# Patient Record
Sex: Female | Born: 1960
Health system: Southern US, Community
[De-identification: ages and names within clinical notes are randomized; demographics above are authoritative.]

## PROBLEM LIST (undated history)

## (undated) HISTORY — PX: APPENDECTOMY: SHX54

## (undated) HISTORY — PX: FOOT SURGERY: SHX648

## (undated) HISTORY — PX: CARPAL TUNNEL RELEASE: SHX101

## (undated) HISTORY — PX: OTHER SURGICAL HISTORY: SHX169

---

## 1998-04-04 ENCOUNTER — Other Ambulatory Visit: Admission: RE | Admit: 1998-04-04 | Discharge: 1998-04-04 | Payer: Self-pay | Admitting: Obstetrics & Gynecology

## 1998-09-06 ENCOUNTER — Ambulatory Visit (HOSPITAL_BASED_OUTPATIENT_CLINIC_OR_DEPARTMENT_OTHER): Admission: RE | Admit: 1998-09-06 | Discharge: 1998-09-06 | Payer: Self-pay | Admitting: Orthopedic Surgery

## 1999-01-17 ENCOUNTER — Ambulatory Visit (HOSPITAL_BASED_OUTPATIENT_CLINIC_OR_DEPARTMENT_OTHER): Admission: RE | Admit: 1999-01-17 | Discharge: 1999-01-17 | Payer: Self-pay | Admitting: Orthopedic Surgery

## 1999-04-16 ENCOUNTER — Other Ambulatory Visit: Admission: RE | Admit: 1999-04-16 | Discharge: 1999-04-16 | Payer: Self-pay | Admitting: Obstetrics & Gynecology

## 2000-04-24 ENCOUNTER — Other Ambulatory Visit: Admission: RE | Admit: 2000-04-24 | Discharge: 2000-04-24 | Payer: Self-pay | Admitting: Obstetrics & Gynecology

## 2000-04-25 ENCOUNTER — Other Ambulatory Visit: Admission: RE | Admit: 2000-04-25 | Discharge: 2000-04-25 | Payer: Self-pay | Admitting: Obstetrics & Gynecology

## 2001-04-28 ENCOUNTER — Other Ambulatory Visit: Admission: RE | Admit: 2001-04-28 | Discharge: 2001-04-28 | Payer: Self-pay | Admitting: Obstetrics & Gynecology

## 2002-04-26 ENCOUNTER — Other Ambulatory Visit: Admission: RE | Admit: 2002-04-26 | Discharge: 2002-04-26 | Payer: Self-pay | Admitting: Obstetrics & Gynecology

## 2003-05-19 ENCOUNTER — Other Ambulatory Visit: Admission: RE | Admit: 2003-05-19 | Discharge: 2003-05-19 | Payer: Self-pay | Admitting: Obstetrics & Gynecology

## 2004-06-13 ENCOUNTER — Other Ambulatory Visit: Admission: RE | Admit: 2004-06-13 | Discharge: 2004-06-13 | Payer: Self-pay | Admitting: Obstetrics & Gynecology

## 2005-06-20 ENCOUNTER — Other Ambulatory Visit: Admission: RE | Admit: 2005-06-20 | Discharge: 2005-06-20 | Payer: Self-pay | Admitting: Obstetrics & Gynecology

## 2009-08-11 ENCOUNTER — Encounter: Admission: RE | Admit: 2009-08-11 | Discharge: 2009-08-11 | Payer: Self-pay | Admitting: Obstetrics & Gynecology

## 2010-09-07 IMAGING — US US BREAST R
1 series · 6 of 6 positions shown · non-contrast
Comparison: Bilateral mammogram July 07, 2007 from [HOSPITAL]
OB/GYN.

CLINICAL DATA: 6-month reevaluation of right breast.  Fibrocystic
changes were identified the 10 o'clock position of the right breast
and January 2009 at [REDACTED].  The patient also
presents for annual evaluation of the left breast.

DIGITAL DIAGNOSTIC  BILATERAL  MAMMOGRAM  WITH CAD AND RIGHT BREAST
ULTRASOUND:

[Series 1: us breast right · 6 of 6 slices shown]
[im 1/6]
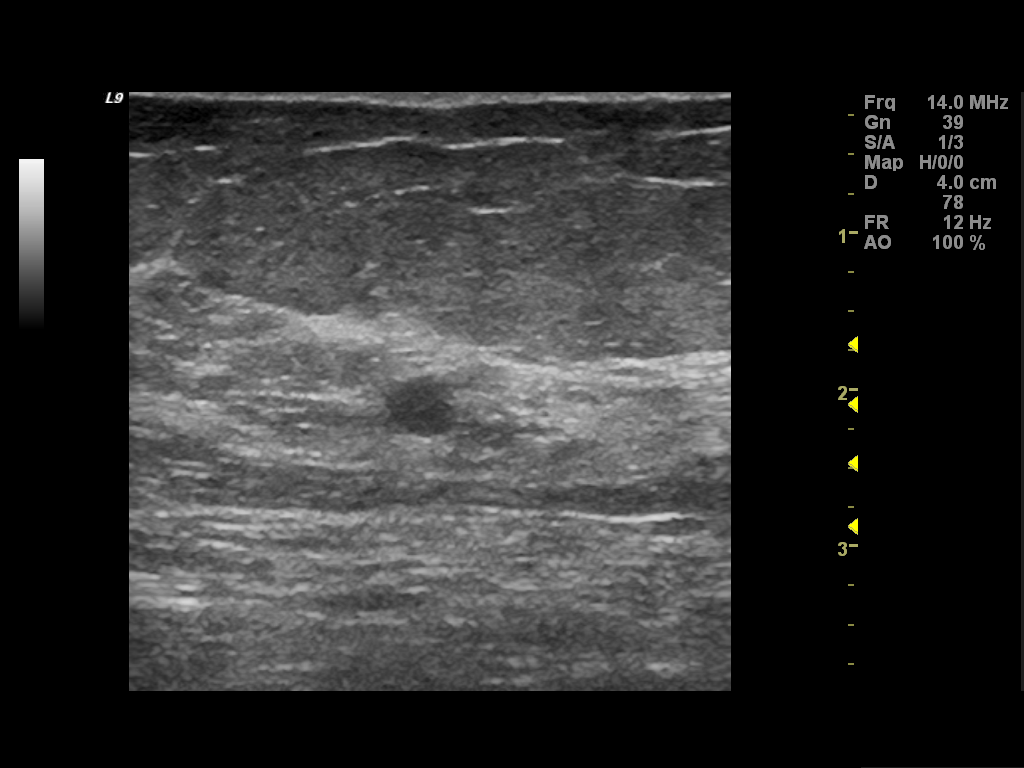
[im 2/6]
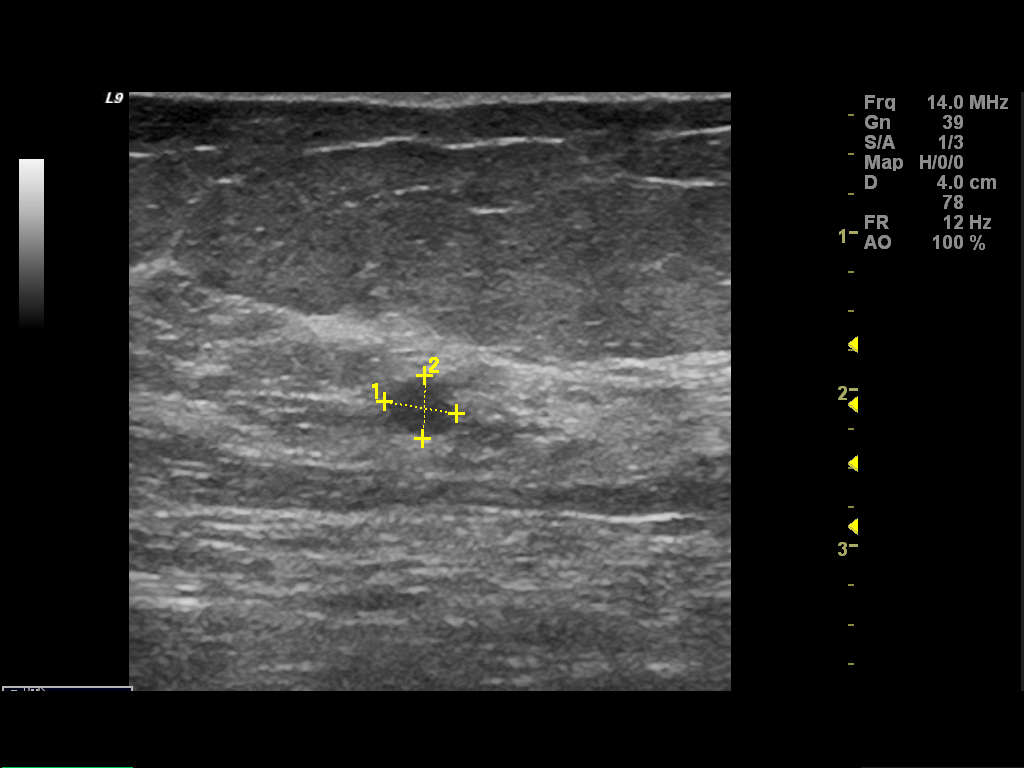
[im 3/6]
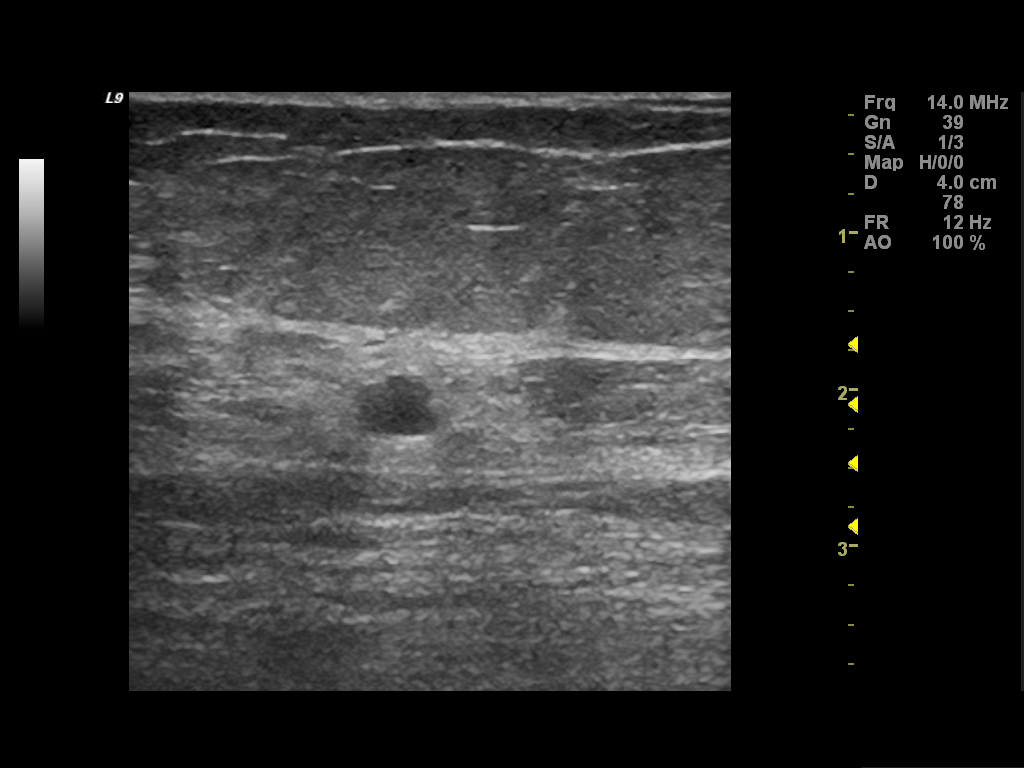
[im 4/6]
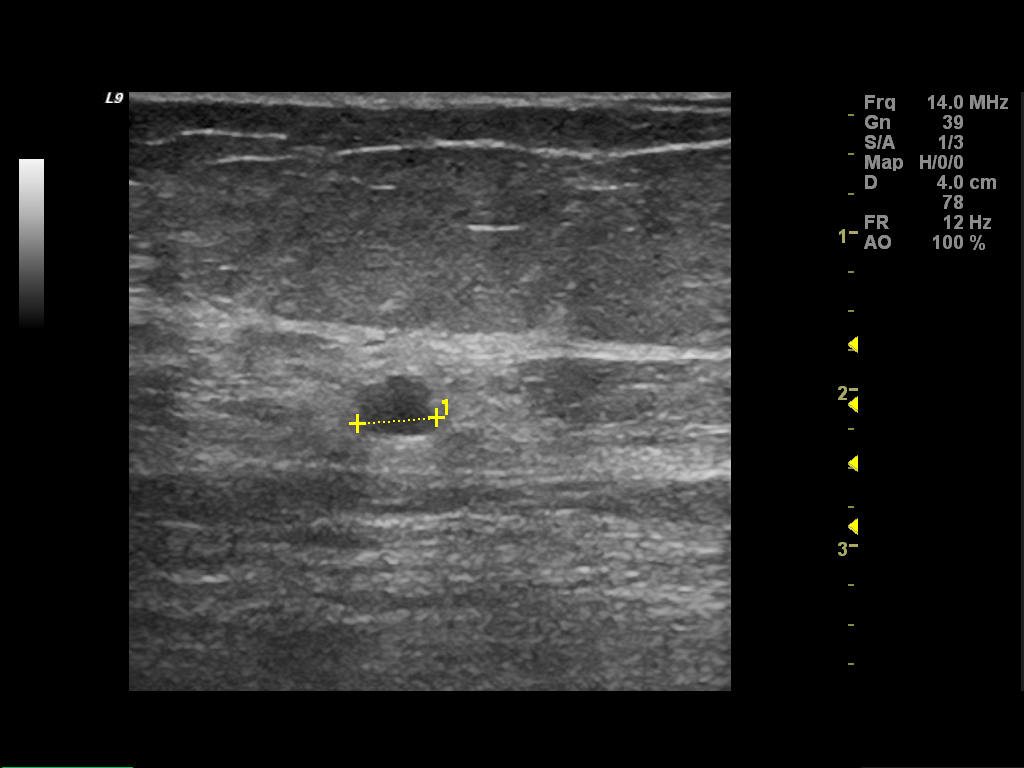
[im 5/6]
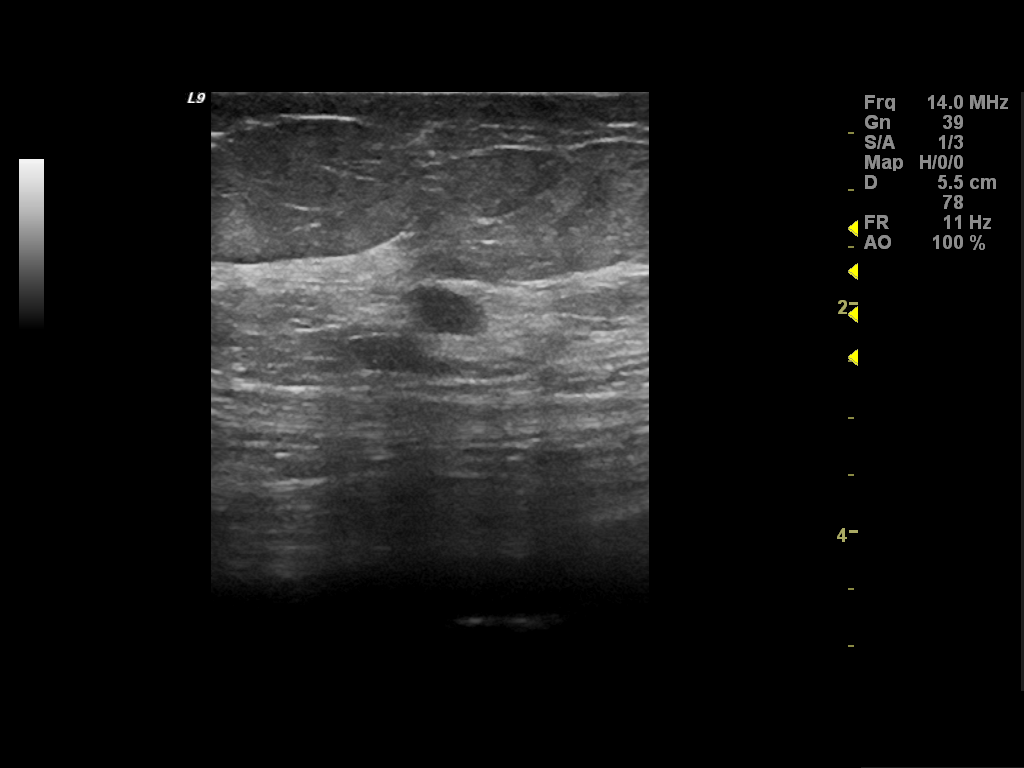
[im 6/6]
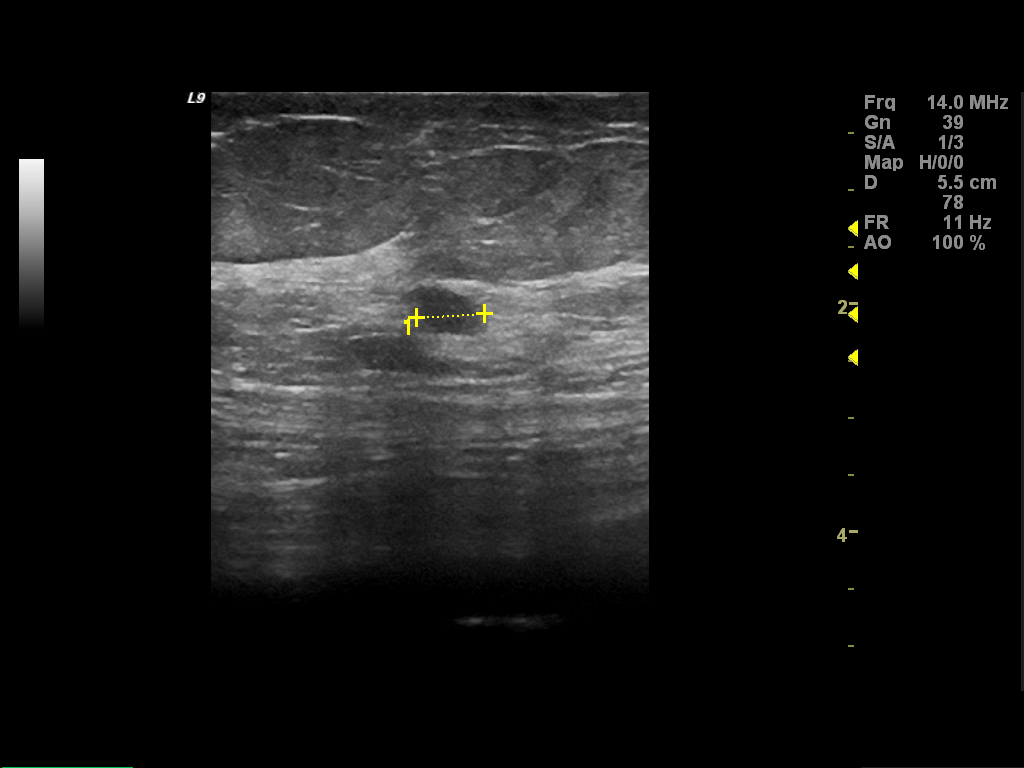

[6 of 6 positions shown; findings below may reference images not displayed]

FINDINGS: The breast parenchyma is heterogeneously dense
bilaterally.  There is stable nodularity in the upper outer
quadrant of the right breast that appears unchanged compared to the
mammogram of 7777.  No suspicious mass, suspicious
microcalcifications, or architectural distortion is identified in
either breast.

On physical exam, no mass is palpated in the upper outer right
breast.

Ultrasound is performed, showing two small cysts in the 10 o'clock
region of the right breast approximately 4 cm from the nipple.  No
suspicious findings are identified.
IMPRESSION: No evidence of malignancy is identified in either breast.  Stable
parenchymal pattern of both breasts.  Nodularity in the outer right
breast is unchanged from 0858, and  small cysts are seen in the
upper outer right breast on ultrasound.  A screening mammogram of
both breasts is recommended in 1 year.

BI-RADS CATEGORY 2:  Benign finding(s).

## 2016-06-04 DIAGNOSIS — D1801 Hemangioma of skin and subcutaneous tissue: Secondary | ICD-10-CM | POA: Diagnosis not present

## 2016-06-04 DIAGNOSIS — D485 Neoplasm of uncertain behavior of skin: Secondary | ICD-10-CM | POA: Diagnosis not present

## 2016-06-04 DIAGNOSIS — L820 Inflamed seborrheic keratosis: Secondary | ICD-10-CM | POA: Diagnosis not present

## 2016-06-04 DIAGNOSIS — L821 Other seborrheic keratosis: Secondary | ICD-10-CM | POA: Diagnosis not present

## 2016-06-04 DIAGNOSIS — L578 Other skin changes due to chronic exposure to nonionizing radiation: Secondary | ICD-10-CM | POA: Diagnosis not present

## 2016-10-30 DIAGNOSIS — H5203 Hypermetropia, bilateral: Secondary | ICD-10-CM | POA: Diagnosis not present

## 2016-11-25 DIAGNOSIS — Z1231 Encounter for screening mammogram for malignant neoplasm of breast: Secondary | ICD-10-CM | POA: Diagnosis not present

## 2016-11-25 DIAGNOSIS — Z6834 Body mass index (BMI) 34.0-34.9, adult: Secondary | ICD-10-CM | POA: Diagnosis not present

## 2016-11-25 DIAGNOSIS — Z01419 Encounter for gynecological examination (general) (routine) without abnormal findings: Secondary | ICD-10-CM | POA: Diagnosis not present

## 2017-05-02 DIAGNOSIS — H5203 Hypermetropia, bilateral: Secondary | ICD-10-CM | POA: Diagnosis not present

## 2017-06-11 DIAGNOSIS — D485 Neoplasm of uncertain behavior of skin: Secondary | ICD-10-CM | POA: Diagnosis not present

## 2017-06-11 DIAGNOSIS — L820 Inflamed seborrheic keratosis: Secondary | ICD-10-CM | POA: Diagnosis not present

## 2017-06-13 DIAGNOSIS — E785 Hyperlipidemia, unspecified: Secondary | ICD-10-CM | POA: Diagnosis not present

## 2017-06-13 DIAGNOSIS — Z6831 Body mass index (BMI) 31.0-31.9, adult: Secondary | ICD-10-CM | POA: Diagnosis not present

## 2017-06-13 DIAGNOSIS — Z1389 Encounter for screening for other disorder: Secondary | ICD-10-CM | POA: Diagnosis not present

## 2017-11-13 ENCOUNTER — Ambulatory Visit (INDEPENDENT_AMBULATORY_CARE_PROVIDER_SITE_OTHER): Payer: BLUE CROSS/BLUE SHIELD | Admitting: Sports Medicine

## 2017-11-13 ENCOUNTER — Ambulatory Visit (INDEPENDENT_AMBULATORY_CARE_PROVIDER_SITE_OTHER): Payer: BLUE CROSS/BLUE SHIELD

## 2017-11-13 ENCOUNTER — Encounter: Payer: Self-pay | Admitting: Sports Medicine

## 2017-11-13 VITALS — BP 129/80 | HR 77 | Ht 62.0 in | Wt 177.0 lb

## 2017-11-13 DIAGNOSIS — Q72891 Other reduction defects of right lower limb: Secondary | ICD-10-CM | POA: Diagnosis not present

## 2017-11-13 DIAGNOSIS — G5761 Lesion of plantar nerve, right lower limb: Secondary | ICD-10-CM

## 2017-11-13 DIAGNOSIS — G5781 Other specified mononeuropathies of right lower limb: Secondary | ICD-10-CM

## 2017-11-13 DIAGNOSIS — M21619 Bunion of unspecified foot: Secondary | ICD-10-CM

## 2017-11-13 DIAGNOSIS — M2040 Other hammer toe(s) (acquired), unspecified foot: Secondary | ICD-10-CM

## 2017-11-13 DIAGNOSIS — M779 Enthesopathy, unspecified: Secondary | ICD-10-CM | POA: Diagnosis not present

## 2017-11-13 DIAGNOSIS — M79671 Pain in right foot: Secondary | ICD-10-CM

## 2017-11-13 MED ORDER — TRIAMCINOLONE ACETONIDE 10 MG/ML IJ SUSP: 10.0000 mg | Freq: Once | INTRAMUSCULAR | Status: AC

## 2017-11-13 NOTE — Progress Notes (Addendum)
Subjective: Melissa Mcguire is a 56 y.o. female patient who presents to office for evaluation of right foot pain. Patient complains of progressive pain especially over the last 2-3 weeks in the right foot at the second and third toes consisting of burning and stinging with occasional numbness.  Patient states that the ball of her foot feels very swollen and enlarged.  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 Patient has tried changing shoes with no relief in symptoms. Patient denies any other pedal complaints. Denies known injury/trip/fall/sprain/any causative factors.   Review of Systems  All other systems reviewed and are negative.   There are no active problems to display for this patient.   No current outpatient medications on file prior to visit.   No current facility-administered medications on file prior to visit.     Allergies  Allergen Reactions  . Butorphanol Nausea And Vomiting and Other (See Comments)    Sedation   . Codeine Nausea And Vomiting and Other (See Comments)    Sedation     Objective:  General: Alert and oriented x3 in no acute distress  Dermatology: No open lesions bilateral lower extremities, no webspace macerations, no ecchymosis bilateral, all nails x 10 are well manicured.  Vascular: Dorsalis Pedis and Posterior Tibial pedal pulses palpable, Capillary Fill Time 3 seconds,(+) pedal hair growth bilateral, no edema bilateral lower extremities, Temperature gradient within normal limits.  Neurology: Johney Maine sensation intact via light touch bilateral,  No babinski sign present bilateral. (- )Tinels sign bilateral.  No palpable Mulder's click however there is significant fullness at the ball of the right foot and at the intermetatarsal space of the second and third metatarsals on the right likely consistent with neuroma.  Musculoskeletal: Mild tenderness with palpation at the ball of right  foot,No pain with calf compression bilateral. There is decreased ankle rom with knee extending  vs flexed resembling gastroc equnius bilateral, Subtalar joint range of motion is within normal limits, there is no 1st ray hypermobility noted bilateral, decreased 1st MPJ rom Right>Left with functional limitus and bunion noted on weightbearing exam.  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  Strength within normal limits in all groups bilateral.   Gait: Antalgic gait  Xrays  Right foot   Impression: There is mild joint space narrowing at the midtarsal joint consistent of with early arthritis there is deviation of the first metatarsal phalangeal joint and first metatarsal consistent with bunion deformity there is a lesser digital contractures consistent with hammertoe deformity there is narrowing of the intermetatarsal space specifically at the second and third metatarsals there is a congenitally short fourth metatarsal consistent with brachymetatarsia there are no fractures no breaks or acute findings at this time.  Assessment and Plan: Problem List Items Addressed This Visit    None    Visit Diagnoses    Neuroma digital nerve, right    -  Primary   Capsulitis       Relevant Orders   DG Foot Complete Right   Bunion       Hammer toe, unspecified laterality       Brachymetatarsia of right foot       Right foot pain           -Complete examination performed -Xrays reviewed -Discussed treatement options for likely for fluid overload secondary to structural deformity with irritation of nerve and between the right second  and third toes with surrounding joint swelling likely consistent with capsulitis versus neuroma -Patient declined oral medication and states that she does not like to take anything by mouth because she is very sensitive to medicine -After oral consent and aseptic prep, injected a mixture containing 1 ml of 2%  plain lidocaine, 1 ml  0.5% plain marcaine, 0.5 ml of kenalog 10 and 0.5 ml of dexamethasone phosphate into the second interspace at the area of fullness and suspected neuroma without complication. Post-injection care discussed with patient.  -Applied metatarsal padding to shoes, recommend good supportive shoes daily refraining from barefoot walking -Advised patient rest ice elevation and modification of activities -Patient to return to office in 3 weeks or sooner if condition worsens.  If condition still worse then will consider adding ultrasound or MRI for further evaluation if patient is doing better patient long-term will benefit from custom orthotics for mechanical control.  Landis Martins, DPM

## 2017-11-13 NOTE — Patient Instructions (Signed)
Morton Neuralgia Morton neuralgia is a type of foot pain in the area closest to your toes. This area is sometimes called the ball of your foot. Morton neuralgia occurs when a branch of a nerve in your foot (digital nerve) becomes compressed. When this happens over a long period of time, the nerve can thicken (neuroma) and cause pain. This usually occurs between the third and fourth toe. Morton neuralgia can come and go but may get worse over time. What are the causes? Your digital nerve can become compressed and stretched at a point where it passes under a thick band of tissue that connects your toes (intermetatarsal ligament). Morton neuralgia can be caused by mild repetitive damage in this area. This type of damage can result from:  Activities such as running or jumping.  Wearing shoes that are too tight.  What increases the risk? You may be at risk for Morton neuralgia if you:  Are female.  Wear high heels.  Wear shoes that are narrow or tight.  Participate in activities that stretch your toes. These include: ? Running. ? Ballet. ? Long-distance walking.  What are the signs or symptoms? The first symptom of Morton neuralgia is pain that spreads from the ball of your foot to your toes. It may feel like you are walking on a marble. Pain usually gets worse with walking and goes away at night. Other symptoms may include numbness and cramping of your toes. How is this diagnosed? Your health care provider will do a physical exam. When doing the exam, your health care provider may:  Squeeze your foot just behind your toe.  Ask you to move your toes to check for pain.  You may also have tests on your foot to confirm the diagnosis. These may include:  An X-ray.  An MRI.  How is this treated? Treatment for Morton neuralgia may be as simple as changing the kind of shoes you wear. Other treatments may include:  Wearing a supportive pad (orthosis) under the front of your foot. This  lifts your toe bones and takes pressure off the nerve.  Getting injections of numbing medicine and anti-inflammatory medicine (steroid) in the nerve.  Having surgery to remove part of the thickened nerve.  Follow these instructions at home:  Take medicine only as directed by your health care provider.  Wear soft-soled shoes with a wide toe area.  Stop activities that may be causing pain.  Elevate your foot when resting.  Massage your foot.  Apply ice to the injured area: ? Put ice in a plastic bag. ? Place a towel between your skin and the bag. ? Leave the ice on for 20 minutes, 2-3 times a day.  Keep all follow-up visits as directed by your health care provider. This is important. Contact a health care provider if:  Home care instructions are not helping you get better.  Your symptoms change or get worse. This information is not intended to replace advice given to you by your health care provider. Make sure you discuss any questions you have with your health care provider. Document Released: 03/10/2001 Document Revised: 05/09/2016 Document Reviewed: 02/02/2014 Elsevier Interactive Patient Education  2018 [employer]  

## 2017-11-18 ENCOUNTER — Other Ambulatory Visit: Payer: Self-pay | Admitting: Sports Medicine

## 2017-11-18 DIAGNOSIS — G5781 Other specified mononeuropathies of right lower limb: Secondary | ICD-10-CM

## 2017-12-01 DIAGNOSIS — Z1231 Encounter for screening mammogram for malignant neoplasm of breast: Secondary | ICD-10-CM | POA: Diagnosis not present

## 2017-12-01 DIAGNOSIS — Z6833 Body mass index (BMI) 33.0-33.9, adult: Secondary | ICD-10-CM | POA: Diagnosis not present

## 2017-12-01 DIAGNOSIS — Z01419 Encounter for gynecological examination (general) (routine) without abnormal findings: Secondary | ICD-10-CM | POA: Diagnosis not present

## 2017-12-04 ENCOUNTER — Encounter: Payer: Self-pay | Admitting: Sports Medicine

## 2017-12-04 ENCOUNTER — Ambulatory Visit (INDEPENDENT_AMBULATORY_CARE_PROVIDER_SITE_OTHER): Payer: BLUE CROSS/BLUE SHIELD | Admitting: Sports Medicine

## 2017-12-04 DIAGNOSIS — G5761 Lesion of plantar nerve, right lower limb: Secondary | ICD-10-CM | POA: Diagnosis not present

## 2017-12-04 DIAGNOSIS — M2040 Other hammer toe(s) (acquired), unspecified foot: Secondary | ICD-10-CM | POA: Diagnosis not present

## 2017-12-04 DIAGNOSIS — M779 Enthesopathy, unspecified: Secondary | ICD-10-CM | POA: Diagnosis not present

## 2017-12-04 DIAGNOSIS — G5781 Other specified mononeuropathies of right lower limb: Secondary | ICD-10-CM

## 2017-12-04 DIAGNOSIS — Q72891 Other reduction defects of right lower limb: Secondary | ICD-10-CM

## 2017-12-04 DIAGNOSIS — M79671 Pain in right foot: Secondary | ICD-10-CM | POA: Diagnosis not present

## 2017-12-04 DIAGNOSIS — M21619 Bunion of unspecified foot: Secondary | ICD-10-CM

## 2017-12-04 NOTE — Progress Notes (Signed)
Subjective: Melissa Mcguire is a 57 y.o. female patient who returns to office for follow up evaluation of right foot pain. Patient states that the injection helped, stayed bruised after the shot but it feel better. Has a little twinge but no pain in the foot. States that the metatarsal pads have helped and changing shoes helps. Denies nausea,vomitting,fever, chills, nights sweats or constitutional symptoms.  There are no active problems to display for this patient.   No current outpatient medications on file prior to visit.   Current Facility-Administered Medications on File Prior to Visit  Medication Dose Route Frequency Provider Last Rate Last Dose  . triamcinolone acetonide (KENALOG) 10 MG/ML injection 10 mg  10 mg Other Once Landis Martins, DPM        Allergies  Allergen Reactions  . Butorphanol Nausea And Vomiting and Other (See Comments)    Sedation   . Codeine Nausea And Vomiting and Other (See Comments)    Sedation     Objective:  General: Alert and oriented x3 in no acute distress  Dermatology: No open lesions bilateral lower extremities, no webspace macerations, no ecchymosis bilateral, all nails x 10 are well manicured.  Vascular: Dorsalis Pedis and Posterior Tibial pedal pulses palpable, Capillary Fill Time 3 seconds,(+) pedal hair growth bilateral, no edema bilateral lower extremities, Temperature gradient within normal limits.  Neurology: Johney Maine sensation intact via light touch bilateral,  No babinski sign present bilateral. (- )Tinels sign bilateral.  No palpable Mulder's click however there is decreased fullness at the ball of the right foot and at the intermetatarsal space of the second and third metatarsals on the right likely consistent with neuroma.  Musculoskeletal: Decreased tenderness with palpation at the ball of right foot,No pain with calf compression bilateral. There is decreased ankle rom with knee extending  vs flexed resembling gastroc equnius bilateral,  Subtalar joint range of motion is within normal limits, there is no 1st ray hypermobility noted bilateral, decreased 1st MPJ rom Right>Left with functional limitus and bunion noted on weightbearing exam.  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  Strength within normal limits in all groups bilateral.   Assessment and Plan: Problem List Items Addressed This Visit    None    Visit Diagnoses    Capsulitis    -  Primary   Neuroma digital nerve, right       Bunion       Hammer toe, unspecified laterality       Brachymetatarsia of right foot       Right foot pain           -Complete examination performed -Discussed continued care for capsulitis secondary to structural deformity with irritation of nerve and between the right second and third toes with surrounding joint swelling likely consistent with capsulitis versus neuroma -No re-injection due to improving symptoms  -Dispensed metatarsal padding  -Advised patient rest ice elevation and modification of activities and topical pain creams as needed -Office to check orthotic coverage and then schedule for casting if patient desires them -Patient to return to office as needed or sooner if condition worsens.   Landis Martins, DPM

## 2018-01-22 ENCOUNTER — Ambulatory Visit (INDEPENDENT_AMBULATORY_CARE_PROVIDER_SITE_OTHER): Payer: BLUE CROSS/BLUE SHIELD | Admitting: Orthotics

## 2018-01-22 DIAGNOSIS — M79671 Pain in right foot: Secondary | ICD-10-CM

## 2018-01-22 DIAGNOSIS — M779 Enthesopathy, unspecified: Secondary | ICD-10-CM

## 2018-01-22 NOTE — Progress Notes (Signed)
Patient came into today to be cast for Custom Foot Orthotics. Upon recommendation of Dr. Cannon Kettle  Patient presents with capsulitis 2nd R Goals are reduce ff pressure and offload 2 met head Plan vendor [employer] to fab f/o w/ good arch support, met pad and offload 2nd R  Per Horris Latino:  Ok to [employer] quote of $300.Marland Kitchen  Patient to p/up in [place]

## 2018-02-26 ENCOUNTER — Ambulatory Visit (INDEPENDENT_AMBULATORY_CARE_PROVIDER_SITE_OTHER): Payer: BLUE CROSS/BLUE SHIELD | Admitting: *Deleted

## 2018-02-26 DIAGNOSIS — M779 Enthesopathy, unspecified: Secondary | ICD-10-CM

## 2018-02-26 DIAGNOSIS — M79671 Pain in right foot: Secondary | ICD-10-CM

## 2018-03-19 ENCOUNTER — Ambulatory Visit: Payer: BLUE CROSS/BLUE SHIELD | Admitting: Orthotics

## 2018-03-19 DIAGNOSIS — M21619 Bunion of unspecified foot: Secondary | ICD-10-CM

## 2018-03-19 DIAGNOSIS — M779 Enthesopathy, unspecified: Secondary | ICD-10-CM

## 2018-03-19 NOTE — Progress Notes (Signed)
Remake f/o w/ spenco cover, remove pad, and hug arch more.

## 2018-04-02 ENCOUNTER — Ambulatory Visit: Payer: BLUE CROSS/BLUE SHIELD | Admitting: Orthotics

## 2018-04-02 DIAGNOSIS — M21619 Bunion of unspecified foot: Secondary | ICD-10-CM

## 2018-04-02 NOTE — Progress Notes (Signed)
Patient picked up adjusted f/o arch is now higher and aptient is satisfied.

## 2018-04-09 ENCOUNTER — Ambulatory Visit: Payer: BLUE CROSS/BLUE SHIELD | Admitting: Sports Medicine

## 2018-04-16 ENCOUNTER — Ambulatory Visit (INDEPENDENT_AMBULATORY_CARE_PROVIDER_SITE_OTHER): Payer: BLUE CROSS/BLUE SHIELD | Admitting: Sports Medicine

## 2018-04-16 ENCOUNTER — Encounter: Payer: Self-pay | Admitting: Sports Medicine

## 2018-04-16 DIAGNOSIS — Z4789 Encounter for other orthopedic aftercare: Secondary | ICD-10-CM

## 2018-04-16 DIAGNOSIS — M779 Enthesopathy, unspecified: Secondary | ICD-10-CM

## 2018-04-16 DIAGNOSIS — M2040 Other hammer toe(s) (acquired), unspecified foot: Secondary | ICD-10-CM

## 2018-04-16 DIAGNOSIS — M21619 Bunion of unspecified foot: Secondary | ICD-10-CM

## 2018-04-16 DIAGNOSIS — Q72891 Other reduction defects of right lower limb: Secondary | ICD-10-CM | POA: Diagnosis not present

## 2018-04-16 DIAGNOSIS — M79671 Pain in right foot: Secondary | ICD-10-CM

## 2018-04-16 NOTE — Progress Notes (Signed)
Subjective: Melissa Mcguire is a 57 y.o. female patient who returns to office for follow up orthotic check and for evaluation of right foot pain. Patient states that the orthotics help, got them 2 weeks ago; decrease in pain to right foot however has pain at bunion after playing pickle ball a few weeks ago. Patient states that its better than it was but every now and again still has pain that shoots across the joint. Denies bruising, swelling, rubbing, or constitutional symptoms at this time.  There are no active problems to display for this patient.   No current outpatient medications on file prior to visit.   Current Facility-Administered Medications on File Prior to Visit  Medication Dose Route Frequency Provider Last Rate Last Dose  . triamcinolone acetonide (KENALOG) 10 MG/ML injection 10 mg  10 mg Other Once Landis Martins, DPM        Allergies  Allergen Reactions  . Butorphanol Nausea And Vomiting and Other (See Comments)    Sedation   . Codeine Nausea And Vomiting and Other (See Comments)    Sedation     Objective:  General: Alert and oriented x3 in no acute distress  Dermatology: No open lesions bilateral lower extremities, no webspace macerations, no ecchymosis bilateral, all nails x 10 are well manicured.  Vascular: Dorsalis Pedis and Posterior Tibial pedal pulses palpable, Capillary Fill Time 3 seconds,(+) pedal hair growth bilateral, no edema bilateral lower extremities, Temperature gradient within normal limits.  Neurology: Johney Maine sensation intact via light touch bilateral,  No babinski sign present bilateral. (- )Tinels sign bilateral.  No palpable Mulder's click however there is decreased fullness at the ball of the right foot and at the intermetatarsal space of the second and third metatarsals on the right likely consistent with neuroma.  Musculoskeletal: Decreased tenderness with palpation at the ball of right foot,No pain with calf compression bilateral. There is  decreased ankle rom with knee extending  vs flexed resembling gastroc equnius bilateral, Subtalar joint range of motion is within normal limits, there is no 1st ray hypermobility noted bilateral, decreased 1st MPJ rom Right>Left with functional limitus and bunion.  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  Strength within normal limits in all groups bilateral.   Orthotic: Contours arch and provides appropriate support to problem areas  Assessment and Plan: Problem List Items Addressed This Visit    None    Visit Diagnoses    Encounter for training in use of orthotic device    -  Primary   Bunion       Capsulitis       Hammer toe, unspecified laterality       Brachymetatarsia of right foot       Right foot pain          -Complete examination performed -Educated patient on long term use of orthotics and likely sprain that is slowly getting better at right bunion vs capsulitis -Patient declined injection or anti-inflammatories this visit  -Recommend continue with good supportive shoes and use of orthotics  -Advised patient to return to office if bunion pain does not continue to improve with rest, ice, elevation, topical pain creams and soaking -Patient to return to office as needed or sooner if condition worsens.   Landis Martins, DPM

## 2018-05-20 DIAGNOSIS — Z6833 Body mass index (BMI) 33.0-33.9, adult: Secondary | ICD-10-CM | POA: Diagnosis not present

## 2018-05-20 DIAGNOSIS — G4730 Sleep apnea, unspecified: Secondary | ICD-10-CM | POA: Diagnosis not present

## 2018-06-04 DIAGNOSIS — G4733 Obstructive sleep apnea (adult) (pediatric): Secondary | ICD-10-CM | POA: Diagnosis not present

## 2018-06-11 ENCOUNTER — Ambulatory Visit (INDEPENDENT_AMBULATORY_CARE_PROVIDER_SITE_OTHER): Payer: BLUE CROSS/BLUE SHIELD | Admitting: Sports Medicine

## 2018-06-11 ENCOUNTER — Telehealth: Payer: Self-pay | Admitting: *Deleted

## 2018-06-11 ENCOUNTER — Encounter: Payer: Self-pay | Admitting: Sports Medicine

## 2018-06-11 DIAGNOSIS — L820 Inflamed seborrheic keratosis: Secondary | ICD-10-CM | POA: Diagnosis not present

## 2018-06-11 DIAGNOSIS — L578 Other skin changes due to chronic exposure to nonionizing radiation: Secondary | ICD-10-CM | POA: Diagnosis not present

## 2018-06-11 DIAGNOSIS — C44319 Basal cell carcinoma of skin of other parts of face: Secondary | ICD-10-CM | POA: Diagnosis not present

## 2018-06-11 DIAGNOSIS — Q72891 Other reduction defects of right lower limb: Secondary | ICD-10-CM

## 2018-06-11 DIAGNOSIS — M79671 Pain in right foot: Secondary | ICD-10-CM

## 2018-06-11 DIAGNOSIS — M779 Enthesopathy, unspecified: Secondary | ICD-10-CM | POA: Diagnosis not present

## 2018-06-11 DIAGNOSIS — L821 Other seborrheic keratosis: Secondary | ICD-10-CM | POA: Diagnosis not present

## 2018-06-11 DIAGNOSIS — D3610 Benign neoplasm of peripheral nerves and autonomic nervous system, unspecified: Secondary | ICD-10-CM

## 2018-06-11 DIAGNOSIS — M2040 Other hammer toe(s) (acquired), unspecified foot: Secondary | ICD-10-CM

## 2018-06-11 DIAGNOSIS — Z01812 Encounter for preprocedural laboratory examination: Secondary | ICD-10-CM

## 2018-06-11 DIAGNOSIS — M21619 Bunion of unspecified foot: Secondary | ICD-10-CM

## 2018-06-11 MED ORDER — TRIAMCINOLONE ACETONIDE 10 MG/ML IJ SUSP: 10.0000 mg | Freq: Once | INTRAMUSCULAR | Status: AC

## 2018-06-11 NOTE — Telephone Encounter (Signed)
-----   Message from Landis Martins, [place] sent at 06/11/2018 10:11 AM EDT ----- Regarding: MRI R foot Eval for 2nd interspace neuroma Patient also has pain over bunion and numbness

## 2018-06-11 NOTE — Telephone Encounter (Signed)
Orders to J. Quintana, RN for pre-cert. 

## 2018-06-11 NOTE — Progress Notes (Signed)
Subjective: Melissa Mcguire is a 57 y.o. female patient who returns to office for follow up evaluation of right foot pain. Patient states that the orthotics help, however still has pain especially first few steps out of bed in the morning with feeling fullness to the ball that has been progressive over the last few weeks reports that the pain is 7 out of 10 also with numbness to the second and third toes and over the bunion.  Patient reports that she wants to discuss possible long-term treatment for this.  Patient states that injections in past have helped however she feels like it is temporary and does not give her long-term relief. Denies constitutional symptoms at this time.  There are no active problems to display for this patient.   No current outpatient medications on file prior to visit.   Current Facility-Administered Medications on File Prior to Visit  Medication Dose Route Frequency Provider Last Rate Last Dose  . triamcinolone acetonide (KENALOG) 10 MG/ML injection 10 mg  10 mg Other Once Landis Martins, DPM        Allergies  Allergen Reactions  . Butorphanol Nausea And Vomiting and Other (See Comments)    Sedation   . Codeine Nausea And Vomiting and Other (See Comments)    Sedation     Objective:  General: Alert and oriented x3 in no acute distress  Dermatology: No open lesions bilateral lower extremities, no webspace macerations, no ecchymosis bilateral, all nails x 10 are well manicured.  Vascular: Dorsalis Pedis and Posterior Tibial pedal pulses palpable, Capillary Fill Time 3 seconds,(+) pedal hair growth bilateral, no edema bilateral lower extremities, Temperature gradient within normal limits.  Neurology: Johney Maine sensation intact via light touch bilateral,  No babinski sign present bilateral. (- )Tinels sign bilateral.  No palpable Mulder's click however there is mild fullness at the ball of the right foot and at the intermetatarsal space of the second and third  metatarsals on the right likely consistent with neuroma versus capsulitis secondary to overuse and foot deformity.  Musculoskeletal: Mild tenderness with palpation at the ball of right foot,No pain with calf compression bilateral. There is decreased ankle rom with knee extending  vs flexed resembling gastroc equnius bilateral, Subtalar joint range of motion is within normal limits, there is no 1st ray hypermobility noted bilateral, decreased 1st MPJ rom Right>Left with functional limitus and bunion.  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  Strength within normal limits in all groups bilateral.    Assessment and Plan: Problem List Items Addressed This Visit    None    Visit Diagnoses    Bunion    -  Primary   Capsulitis       Relevant Medications   triamcinolone acetonide (KENALOG) 10 MG/ML injection 10 mg   Hammer toe, unspecified laterality       Brachymetatarsia of right foot       Right foot pain       Relevant Medications   triamcinolone acetonide (KENALOG) 10 MG/ML injection 10 mg   Neuroma       Relevant Medications   triamcinolone acetonide (KENALOG) 10 MG/ML injection 10 mg      -Complete examination performed -Discussed treatment options for foot deformity and capsulitis versus neuroma -After oral consent and aseptic prep, injected a mixture containing 1 ml of 2%  plain lidocaine, 1 ml 0.5% plain marcaine, 0.5 ml of kenalog 10 and 0.5 ml of dexamethasone phosphate into right  second intermetatarsal space without complication. Post-injection care discussed with patient.   -Recommend continue with good supportive shoes and use of orthotics  -Ordered MRI for further evaluation and for surgical planning for possible need for excision of neuroma hammertoe correction and bunion correction and possibly addressing her congenitally short fourth toe as well -Patient to return to after MRI or sooner if condition worsens.   Landis Martins,  DPM

## 2018-06-12 NOTE — Telephone Encounter (Signed)
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 I gave pt the [place] [phone].

## 2018-06-12 NOTE — Telephone Encounter (Signed)
[name] scheduled pt for 06/15/2018 at 10:30am arrive at 10:15am. Faxed orders and PA to [facility].

## 2018-06-15 DIAGNOSIS — M79671 Pain in right foot: Secondary | ICD-10-CM | POA: Diagnosis not present

## 2018-06-15 DIAGNOSIS — D3610 Benign neoplasm of peripheral nerves and autonomic nervous system, unspecified: Secondary | ICD-10-CM | POA: Diagnosis not present

## 2018-06-22 DIAGNOSIS — Z6833 Body mass index (BMI) 33.0-33.9, adult: Secondary | ICD-10-CM | POA: Diagnosis not present

## 2018-06-22 DIAGNOSIS — Z13220 Encounter for screening for lipoid disorders: Secondary | ICD-10-CM | POA: Diagnosis not present

## 2018-06-22 DIAGNOSIS — Z1339 Encounter for screening examination for other mental health and behavioral disorders: Secondary | ICD-10-CM | POA: Diagnosis not present

## 2018-06-22 DIAGNOSIS — Z1331 Encounter for screening for depression: Secondary | ICD-10-CM | POA: Diagnosis not present

## 2018-06-22 DIAGNOSIS — Z0000 Encounter for general adult medical examination without abnormal findings: Secondary | ICD-10-CM | POA: Diagnosis not present

## 2018-06-23 DIAGNOSIS — E785 Hyperlipidemia, unspecified: Secondary | ICD-10-CM | POA: Diagnosis not present

## 2018-06-23 DIAGNOSIS — R531 Weakness: Secondary | ICD-10-CM | POA: Diagnosis not present

## 2018-06-25 ENCOUNTER — Encounter: Payer: Self-pay | Admitting: Sports Medicine

## 2018-07-01 ENCOUNTER — Encounter: Payer: Self-pay | Admitting: Sports Medicine

## 2018-07-01 ENCOUNTER — Ambulatory Visit (INDEPENDENT_AMBULATORY_CARE_PROVIDER_SITE_OTHER): Payer: BLUE CROSS/BLUE SHIELD | Admitting: Sports Medicine

## 2018-07-01 DIAGNOSIS — Q72891 Other reduction defects of right lower limb: Secondary | ICD-10-CM

## 2018-07-01 DIAGNOSIS — M779 Enthesopathy, unspecified: Secondary | ICD-10-CM

## 2018-07-01 DIAGNOSIS — M2040 Other hammer toe(s) (acquired), unspecified foot: Secondary | ICD-10-CM | POA: Diagnosis not present

## 2018-07-01 DIAGNOSIS — M79671 Pain in right foot: Secondary | ICD-10-CM

## 2018-07-01 DIAGNOSIS — M21619 Bunion of unspecified foot: Secondary | ICD-10-CM

## 2018-07-01 NOTE — Progress Notes (Signed)
Subjective: Melissa Mcguire is a 57 y.o. female patient who returns to office for follow up evaluation of right foot pain. Patient is here for discussion of MRI results.  Patient reports that she still has pain with no significant change that is long-lasting states that she has been using her orthotics and good supportive shoes however she still has pain across the bunion and to the ball of the foot.  Patient also admits to a family history of arthritis but does deny any other constitutional symptoms like nausea, vomiting, fever, chills or increased swelling or warmth.  No other issues noted.  There are no active problems to display for this patient.   No current outpatient medications on file prior to visit.   Current Facility-Administered Medications on File Prior to Visit  Medication Dose Route Frequency Provider Last Rate Last Dose  . triamcinolone acetonide (KENALOG) 10 MG/ML injection 10 mg  10 mg Other Once Landis Martins, DPM      . triamcinolone acetonide (KENALOG) 10 MG/ML injection 10 mg  10 mg Other Once Landis Martins, DPM        Allergies  Allergen Reactions  . Butorphanol Nausea And Vomiting and Other (See Comments)    Sedation   . Codeine Nausea And Vomiting and Other (See Comments)    Sedation     Objective:  General: Alert and oriented x3 in no acute distress  Dermatology: No open lesions bilateral lower extremities, no webspace macerations, no ecchymosis bilateral, all nails x 10 are well manicured.  Vascular: Dorsalis Pedis and Posterior Tibial pedal pulses palpable, Capillary Fill Time 3 seconds,(+) pedal hair growth bilateral, no edema bilateral lower extremities, Temperature gradient within normal limits.  Neurology: Gross sensation intact via light touch bilateral.  Subjective shooting pain and tingling to toes.  Musculoskeletal: Mild tenderness with palpation at the ball of right foot,No pain with calf compression bilateral. There is decreased ankle rom with  knee extending  vs flexed resembling gastroc equnius bilateral, Subtalar joint range of motion is within normal limits, there is no 1st ray hypermobility noted bilateral, decreased 1st MPJ rom Right>Left with functional limitus and bunion that is tender to touch.  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  Strength within normal limits in all groups bilateral.    Assessment and Plan: Problem List Items Addressed This Visit    None    Visit Diagnoses    Bunion    -  Primary   Capsulitis       Hammer toe, unspecified laterality       Brachymetatarsia of right foot       Right foot pain          -Complete examination performed -Discussed treatment options for foot deformity and capsulitis in the setting of bunion with lesser foot deformity -MRI results reviewed revealing arthritis and bunion deformity at the first metatarsophalangeal joint on the right foot -Discussed treatment options patient declined oral anti-inflammatory at this time and wants to consider surgery however wants to wait to discuss this option with her husband I also advised patient due to her family history of arthritis to consider to have a arthritic panel performed before planning for surgery of which patient expressed understanding -Recommend continue with good supportive shoes and use of orthotics  -Continue to limit activities to tolerance -Patient to return to office for further discussion of treatment options/surgery consultation or sooner if condition worsens.   Landis Martins, DPM

## 2018-07-04 DIAGNOSIS — G4733 Obstructive sleep apnea (adult) (pediatric): Secondary | ICD-10-CM | POA: Diagnosis not present

## 2018-07-09 ENCOUNTER — Encounter: Payer: Self-pay | Admitting: Sports Medicine

## 2018-07-09 ENCOUNTER — Ambulatory Visit (INDEPENDENT_AMBULATORY_CARE_PROVIDER_SITE_OTHER): Payer: BLUE CROSS/BLUE SHIELD | Admitting: Sports Medicine

## 2018-07-09 DIAGNOSIS — Q72891 Other reduction defects of right lower limb: Secondary | ICD-10-CM

## 2018-07-09 DIAGNOSIS — M79671 Pain in right foot: Secondary | ICD-10-CM

## 2018-07-09 DIAGNOSIS — M2040 Other hammer toe(s) (acquired), unspecified foot: Secondary | ICD-10-CM

## 2018-07-09 DIAGNOSIS — M21611 Bunion of right foot: Secondary | ICD-10-CM | POA: Diagnosis not present

## 2018-07-09 DIAGNOSIS — M779 Enthesopathy, unspecified: Secondary | ICD-10-CM | POA: Diagnosis not present

## 2018-07-09 DIAGNOSIS — M21619 Bunion of unspecified foot: Secondary | ICD-10-CM

## 2018-07-09 NOTE — Patient Instructions (Signed)
Pre-Operative Instructions  Congratulations, you have decided to take an important step towards improving your quality of life.  You can be assured that the doctors and staff at [facility] will be with you every step of the way.  Here are some important things you should know:  1. Plan to be at the surgery center/hospital at least 1 (one) hour prior to your scheduled time, unless otherwise directed by the surgical center/hospital staff.  You must have a responsible adult accompany you, remain during the surgery and drive you home.  Make sure you have directions to the surgical center/hospital to ensure you arrive on time. 2. If you are having surgery at [facility] or  hospitals, you will need a copy of your medical history and physical form from your family physician within one month prior to the date of surgery. We will give you a form for your primary physician to complete.  3. We make every effort to accommodate the date you request for surgery.  However, there are times where surgery dates or times have to be moved.  We will contact you as soon as possible if a change in schedule is required.   4. No aspirin/ibuprofen for one week before surgery.  If you are on aspirin, any non-steroidal anti-inflammatory medications (Mobic, Aleve, Ibuprofen) should not be taken seven (7) days prior to your surgery.  You make take Tylenol for pain prior to surgery.  5. Medications - If you are taking daily heart and blood pressure medications, seizure, reflux, allergy, asthma, anxiety, pain or diabetes medications, make sure you notify the surgery center/hospital before the day of surgery so they can tell you which medications you should take or avoid the day of surgery. 6. No food or drink after midnight the night before surgery unless directed otherwise by surgical center/hospital staff. 7. No alcoholic beverages 24-hours prior to surgery.  No smoking 24-hours prior or 24-hours after  surgery. 8. Wear loose pants or shorts. They should be loose enough to fit over bandages, boots, and casts. 9. Don't wear slip-on shoes. Sneakers are preferred. 10. Bring your boot with you to the surgery center/hospital.  Also bring crutches or a walker if your physician has prescribed it for you.  If you do not have this equipment, it will be provided for you after surgery. 11. If you have not been contacted by the surgery center/hospital by the day before your surgery, call to confirm the date and time of your surgery. 12. Leave-time from work may vary depending on the type of surgery you have.  Appropriate arrangements should be made prior to surgery with your employer. 13. Prescriptions will be provided immediately following surgery by your doctor.  Fill these as soon as possible after surgery and take the medication as directed. Pain medications will not be refilled on weekends and must be approved by the doctor. 14. Remove nail polish on the operative foot and avoid getting pedicures prior to surgery. 15. Wash the night before surgery.  The night before surgery wash the foot and leg well with water and the antibacterial soap provided. Be sure to pay special attention to beneath the toenails and in between the toes.  Wash for at least three (3) minutes. Rinse thoroughly with water and dry well with a towel.  Perform this wash unless told not to do so by your physician.  Enclosed: 1 Ice pack (please put in freezer the night before surgery)   1 Hibiclens skin cleaner     Pre-op instructions  If you have any questions regarding the instructions, please do not hesitate to call our office.  [place]: [address], [place], [place] [address] -- [phone]  [place]: [address] [place], [place] [address] -- [phone]  [place]: [address]  [place], [place] [address] -- [phone]  [place]: [address], [address], [place], [place] [address] -- [phone]  Website:  [email or web address] 

## 2018-07-09 NOTE — Progress Notes (Signed)
Subjective: Melissa Mcguire is a 57 y.o. female patient who returns to office for follow up evaluation of right foot pain. Patient is here for surgery consult.  Reports that pain is the same in her foot and has difficulty with wearing shoes states that pain is across the bunion in the ball of the foot that is pretty consistent and limits her with being able to participate in recreational dance.  Patient is assisted by her husband this visit.  No other issues noted.  There are no active problems to display for this patient.   No current outpatient medications on file prior to visit.   Current Facility-Administered Medications on File Prior to Visit  Medication Dose Route Frequency Provider Last Rate Last Dose  . triamcinolone acetonide (KENALOG) 10 MG/ML injection 10 mg  10 mg Other Once Landis Martins, DPM      . triamcinolone acetonide (KENALOG) 10 MG/ML injection 10 mg  10 mg Other Once Landis Martins, DPM        Allergies  Allergen Reactions  . Butorphanol Nausea And Vomiting and Other (See Comments)    Sedation   . Codeine Nausea And Vomiting and Other (See Comments)    Sedation    History reviewed. No pertinent surgical history.  History reviewed. No pertinent family history.  Social History   Socioeconomic History  . Marital status: Married    Spouse name: Not on file  . Number of children: Not on file  . Years of education: Not on file  . Highest education level: Not on file  Occupational History  . Not on file  Social Needs  . Financial resource strain: Not on file  . Food insecurity:    Worry: Not on file    Inability: Not on file  . Transportation needs:    Medical: Not on file    Non-medical: Not on file  Tobacco Use  . Smoking status: Never Smoker  . Smokeless tobacco: Never Used  Substance and Sexual Activity  . Alcohol use: Not on file  . Drug use: Not on file  . Sexual activity: Not on file  Lifestyle  . Physical activity:    Days per week: Not on  file    Minutes per session: Not on file  . Stress: Not on file  Relationships  . Social connections:    Talks on phone: Not on file    Gets together: Not on file    Attends religious service: Not on file    Active member of club or organization: Not on file    Attends meetings of clubs or organizations: Not on file    Relationship status: Not on file  Other Topics Concern  . Not on file  Social History Narrative  . Not on file    Objective:  General: Alert and oriented x3 in no acute distress  No acute changes with physical exam  Dermatology: No open lesions bilateral lower extremities, no webspace macerations, no ecchymosis bilateral, all nails x 10 are well manicured.  Vascular: Dorsalis Pedis and Posterior Tibial pedal pulses palpable, Capillary Fill Time 3 seconds,(+) pedal hair growth bilateral, no edema bilateral lower extremities, Temperature gradient within normal limits.  Neurology: Gross sensation intact via light touch bilateral.  Subjective shooting pain and tingling to toes.  Musculoskeletal: Mild tenderness with palpation at the ball of right foot,No pain with calf compression bilateral. There is decreased ankle rom with knee extending  vs flexed resembling gastroc equnius bilateral, Subtalar joint range  of motion is within normal limits, there is no 1st ray hypermobility noted bilateral, decreased 1st MPJ rom Right>Left with functional limitus and bunion that is tender to touch.  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  Strength within normal limits in all groups bilateral.    Assessment and Plan: Problem List Items Addressed This Visit    None    Visit Diagnoses    Bunion    -  Primary   Capsulitis       Hammer toe, unspecified laterality       Brachymetatarsia of right foot       Right foot pain          -Complete examination performed -Discussed treatment options for foot deformity and capsulitis in the  setting of bunion with lesser foot deformity -Patient opt for surgical management. Consent obtained for right [facility] bunionectomy with internal fixation, second third and fifth hammertoe repair, for metatarsal osteotomy with lengthening procedure right foot pre and Post op course explained. Risks, benefits, alternatives explained. No guarantees given or implied. Surgical booking slip submitted and provided patient with Surgical packet and info for [place]. -Patient to be nonweightbearing for at minimum 4 weeks ordered knee scooter -Dispensed CAM Walker to use postop -Patient to return to office after surgery or sooner if condition worsens.   Landis Martins, DPM

## 2018-07-10 ENCOUNTER — Telehealth: Payer: Self-pay | Admitting: *Deleted

## 2018-07-10 DIAGNOSIS — M21619 Bunion of unspecified foot: Secondary | ICD-10-CM | POA: Diagnosis not present

## 2018-07-10 DIAGNOSIS — M2040 Other hammer toe(s) (acquired), unspecified foot: Secondary | ICD-10-CM | POA: Diagnosis not present

## 2018-07-10 DIAGNOSIS — M779 Enthesopathy, unspecified: Secondary | ICD-10-CM | POA: Diagnosis not present

## 2018-07-10 NOTE — Telephone Encounter (Signed)
Per Melissa Mcguire said she wants to schedule her surgery.  She asked me to call.

## 2018-07-13 NOTE — Telephone Encounter (Signed)
I am returning your call.  Dr. Cannon Kettle can do the surgery any Monday in August.  "Let's schedule it for August 12."  I'll get it scheduled.  Someone from the surgical center will call you the Friday before your surgery date.  They will give you your arrival time.  You need to go on-line and register with the surgical center, instructions are in the brochure that was given to you.  "Okay, I'll do that."

## 2018-07-13 NOTE — Telephone Encounter (Signed)
I want to schedule my surgery.

## 2018-07-27 ENCOUNTER — Encounter: Payer: Self-pay | Admitting: Sports Medicine

## 2018-07-27 DIAGNOSIS — M2011 Hallux valgus (acquired), right foot: Secondary | ICD-10-CM

## 2018-07-27 DIAGNOSIS — M2040 Other hammer toe(s) (acquired), unspecified foot: Secondary | ICD-10-CM | POA: Diagnosis not present

## 2018-07-27 DIAGNOSIS — M21611 Bunion of right foot: Secondary | ICD-10-CM | POA: Diagnosis not present

## 2018-07-27 DIAGNOSIS — Q72891 Other reduction defects of right lower limb: Secondary | ICD-10-CM | POA: Diagnosis not present

## 2018-07-27 DIAGNOSIS — M21541 Acquired clubfoot, right foot: Secondary | ICD-10-CM

## 2018-07-27 DIAGNOSIS — M2041 Other hammer toe(s) (acquired), right foot: Secondary | ICD-10-CM

## 2018-07-27 DIAGNOSIS — M21619 Bunion of unspecified foot: Secondary | ICD-10-CM | POA: Diagnosis not present

## 2018-07-27 DIAGNOSIS — E7800 Pure hypercholesterolemia, unspecified: Secondary | ICD-10-CM | POA: Diagnosis not present

## 2018-07-27 DIAGNOSIS — M25571 Pain in right ankle and joints of right foot: Secondary | ICD-10-CM | POA: Diagnosis not present

## 2018-07-28 ENCOUNTER — Telehealth: Payer: Self-pay | Admitting: Sports Medicine

## 2018-07-28 NOTE — Telephone Encounter (Signed)
Post op check phone call made to patient. Patient reports that she is doing good and the block is still working. Patient reports she had an episode of nausea but otherwise no other symptoms. I reminded patient to continue with rest, ice, elevation, keeping dressing intact, CAM boot protection and using crutches/knee scooter. I reminded patient of her 1st post op appt. Patient expressed understanding and thanked me for calling. -Dr. Cannon Kettle

## 2018-07-31 ENCOUNTER — Telehealth: Payer: Self-pay | Admitting: Sports Medicine

## 2018-07-31 NOTE — Telephone Encounter (Signed)
Patient call office today stating that she is concerned that her toes are on her surgical foot are numb.  Patient spoke earlier with my medical assistant and was advised to loosen dressing to dangle foot to see if this helps to alleviate any of her symptoms and continue to take her Motrin.  Patient reports that her toes still feel numb and she does feel like her dressing was a little tight but now that has eased up since they have adjusted the Ace bandage.  I advised patient to check bandage again and to check capillary fill time to toes I also advised patient that numbness is typical after having a leg block and that slowly the toes will start to wake back up.  I advised patient that if she is still having pain to continue with her Motrin or take half of her Dilaudid to help.  Patient denied any warmth any redness any fever any shortness of breath or any other constitutional symptoms at this time.  Patient encouraged to call office back if she encounters any other problems or issues meanwhile look forward to seeing Patient as scheduled for first postoperative visit in office. -Dr Cannon Kettle

## 2018-08-04 DIAGNOSIS — G4733 Obstructive sleep apnea (adult) (pediatric): Secondary | ICD-10-CM | POA: Diagnosis not present

## 2018-08-06 ENCOUNTER — Ambulatory Visit (INDEPENDENT_AMBULATORY_CARE_PROVIDER_SITE_OTHER): Payer: BLUE CROSS/BLUE SHIELD

## 2018-08-06 ENCOUNTER — Ambulatory Visit (INDEPENDENT_AMBULATORY_CARE_PROVIDER_SITE_OTHER): Payer: BLUE CROSS/BLUE SHIELD | Admitting: Sports Medicine

## 2018-08-06 ENCOUNTER — Encounter: Payer: Self-pay | Admitting: Sports Medicine

## 2018-08-06 DIAGNOSIS — M21619 Bunion of unspecified foot: Secondary | ICD-10-CM

## 2018-08-06 DIAGNOSIS — Q72891 Other reduction defects of right lower limb: Secondary | ICD-10-CM

## 2018-08-06 DIAGNOSIS — Z9889 Other specified postprocedural states: Secondary | ICD-10-CM

## 2018-08-06 DIAGNOSIS — M2040 Other hammer toe(s) (acquired), unspecified foot: Secondary | ICD-10-CM

## 2018-08-06 DIAGNOSIS — M79671 Pain in right foot: Secondary | ICD-10-CM

## 2018-08-06 DIAGNOSIS — M779 Enthesopathy, unspecified: Secondary | ICD-10-CM

## 2018-08-06 NOTE — Progress Notes (Signed)
Subjective: Melissa Mcguire is a 57 y.o. female patient seen today in office for POV #1 (DOS 07/27/2018, S/P right Altamese [place], second third and fifth hammertoe repair with K wire, for metatarsal osteotomy with elongation secondary to congenitally short fourth metatarsal/brachymet. Patient admits to numbness to toes, denies calf pain, denies headache, chest pain, shortness of breath, nausea, vomiting, fever, or chills. Patient states that she is doing alright and is only taking Ibuprofen. No other issues noted.   There are no active problems to display for this patient.   No current outpatient medications on file prior to visit.   Current Facility-Administered Medications on File Prior to Visit  Medication Dose Route Frequency Provider Last Rate Last Dose  . triamcinolone acetonide (KENALOG) 10 MG/ML injection 10 mg  10 mg Other Once Landis Martins, DPM      . triamcinolone acetonide (KENALOG) 10 MG/ML injection 10 mg  10 mg Other Once Landis Martins, DPM        Allergies  Allergen Reactions  . Butorphanol Nausea And Vomiting and Other (See Comments)    Sedation   . Codeine Nausea And Vomiting and Other (See Comments)    Sedation     Objective: There were no vitals filed for this visit.  General: No acute distress, AAOx3  Right foot: Sutures and K wires intact with no gapping or dehiscence at surgical site, mild swelling right forefoot, no erythema, no warmth, no drainage, no signs of infection noted, Capillary fill time <3 seconds in all digits, gross sensation present via light touch to right foot however there is subjective numbness to toes. No pain or crepitation with range of motion right foot however there is guarding.  No pain with calf compression.   Post Op Xray, right foot excellent alignment and position. Osteotomy site healing. Hardware intact. Soft tissue swelling within normal limits for post op status.   Assessment and Plan:  Problem List Items Addressed This  Visit    None    Visit Diagnoses    Bunion    -  Primary   Relevant Orders   DG Foot Complete Right   Hammer toe, unspecified laterality       Relevant Orders   DG Foot Complete Right   Capsulitis       Relevant Orders   DG Foot Complete Right   Brachymetatarsia of right foot       Relevant Orders   DG Foot Complete Right   Right foot pain       Relevant Orders   DG Foot Complete Right   Post-operative state       Relevant Orders   DG Foot Complete Right       -Patient seen and evaluated -X-rays reviewed -Applied dry sterile dressing to surgical site right foot secured with stockinette -Advised patient to make sure to keep dressings clean, dry, and intact to right surgical site, removing the ACE as needed  -Advised patient to continue with cam boot and nonweightbearing with use of crutches or scooter -Continue with ibuprofen as needed for pain -Advised patient to limit activity to necessity  -Advised patient to ice and elevate as necessary  -Will plan for possible suture removal at next office visit. In the meantime, patient to call office if any issues or problems arise.   Landis Martins, DPM

## 2018-08-13 ENCOUNTER — Encounter: Payer: Self-pay | Admitting: Sports Medicine

## 2018-08-13 ENCOUNTER — Ambulatory Visit (INDEPENDENT_AMBULATORY_CARE_PROVIDER_SITE_OTHER): Payer: BLUE CROSS/BLUE SHIELD | Admitting: Sports Medicine

## 2018-08-13 VITALS — BP 126/81 | HR 83 | Resp 15

## 2018-08-13 DIAGNOSIS — Z9889 Other specified postprocedural states: Secondary | ICD-10-CM

## 2018-08-13 DIAGNOSIS — M2040 Other hammer toe(s) (acquired), unspecified foot: Secondary | ICD-10-CM

## 2018-08-13 DIAGNOSIS — Q72891 Other reduction defects of right lower limb: Secondary | ICD-10-CM

## 2018-08-13 DIAGNOSIS — M79671 Pain in right foot: Secondary | ICD-10-CM

## 2018-08-13 DIAGNOSIS — M21619 Bunion of unspecified foot: Secondary | ICD-10-CM

## 2018-08-13 DIAGNOSIS — M779 Enthesopathy, unspecified: Secondary | ICD-10-CM

## 2018-08-13 NOTE — Progress Notes (Signed)
Subjective: Melissa Mcguire is a 57 y.o. female patient seen today in office for POV #2 (DOS 07/27/2018, S/P right Altamese [place], second third and fifth hammertoe repair with K wire, for metatarsal osteotomy with elongation secondary to congenitally short fourth metatarsal/brachymet. Patient admits to some numbness that comes and goes, twinging and itching, denies calf pain, denies headache, chest pain, shortness of breath, nausea, vomiting, fever, or chills. No other issues noted.   There are no active problems to display for this patient.   No current outpatient medications on file prior to visit.   Current Facility-Administered Medications on File Prior to Visit  Medication Dose Route Frequency Provider Last Rate Last Dose  . triamcinolone acetonide (KENALOG) 10 MG/ML injection 10 mg  10 mg Other Once Landis Martins, DPM      . triamcinolone acetonide (KENALOG) 10 MG/ML injection 10 mg  10 mg Other Once Landis Martins, DPM        Allergies  Allergen Reactions  . Butorphanol Nausea And Vomiting and Other (See Comments)    Sedation   . Codeine Nausea And Vomiting and Other (See Comments)    Sedation     Objective: There were no vitals filed for this visit.  General: No acute distress, AAOx3  Right foot: Sutures and K wires intact with no gapping or dehiscence at surgical site, mild swelling right forefoot, no erythema, no warmth, no drainage, no signs of infection noted, Capillary fill time <3 seconds in all digits, gross sensation present via light touch to right foot however there is subjective numbness to toes as previous. No pain or crepitation with range of motion right foot however there is guarding to surgical sites as previous.  No pain with calf compression.   Assessment and Plan:  Problem List Items Addressed This Visit    None    Visit Diagnoses    Post-operative state    -  Primary   Bunion       Hammer toe, unspecified laterality       Capsulitis       Brachymetatarsia of right foot       Right foot pain           -Patient seen and evaluated -A few sutures were removed; did not remove all due to swelling  -Applied dry sterile dressing to surgical site right foot secured with stockinette -Advised patient to make sure to keep dressings clean, dry, and intact to right surgical site -Advised patient to continue with cam boot and nonweightbearing with use of crutches or scooter -Continue with ibuprofen as needed for pain -Advised patient to limit activity to necessity  -Advised patient to ice and elevate 3x per day and take at least 600mg  Motrin a day to help with the inflammation   -Will plan for finishing suture removal at next office visit. In the meantime, patient to call office if any issues or problems arise.   Landis Martins, DPM

## 2018-08-20 ENCOUNTER — Ambulatory Visit (INDEPENDENT_AMBULATORY_CARE_PROVIDER_SITE_OTHER): Payer: BLUE CROSS/BLUE SHIELD | Admitting: Sports Medicine

## 2018-08-20 ENCOUNTER — Encounter: Payer: Self-pay | Admitting: Sports Medicine

## 2018-08-20 VITALS — BP 146/81 | HR 94 | Temp 98.5°F | Resp 15

## 2018-08-20 DIAGNOSIS — M779 Enthesopathy, unspecified: Secondary | ICD-10-CM

## 2018-08-20 DIAGNOSIS — M21619 Bunion of unspecified foot: Secondary | ICD-10-CM

## 2018-08-20 DIAGNOSIS — M2040 Other hammer toe(s) (acquired), unspecified foot: Secondary | ICD-10-CM

## 2018-08-20 DIAGNOSIS — Q72891 Other reduction defects of right lower limb: Secondary | ICD-10-CM

## 2018-08-20 DIAGNOSIS — M79671 Pain in right foot: Secondary | ICD-10-CM

## 2018-08-20 DIAGNOSIS — Z9889 Other specified postprocedural states: Secondary | ICD-10-CM

## 2018-08-20 NOTE — Progress Notes (Signed)
Subjective: Melissa Mcguire is a 57 y.o. female patient seen today in office for POV #3 DOS 07/27/2018, S/P right Altamese [place], second third and fifth hammertoe repair with K wire, for metatarsal osteotomy with elongation secondary to congenitally short fourth metatarsal/brachymet. Patient admits to continued twitches and numbness, denies calf pain, denies headache, chest pain, shortness of breath, nausea, vomiting, fever, or chills. No other issues noted.   There are no active problems to display for this patient.   No current outpatient medications on file prior to visit.   Current Facility-Administered Medications on File Prior to Visit  Medication Dose Route Frequency Provider Last Rate Last Dose  . triamcinolone acetonide (KENALOG) 10 MG/ML injection 10 mg  10 mg Other Once Landis Martins, DPM      . triamcinolone acetonide (KENALOG) 10 MG/ML injection 10 mg  10 mg Other Once Landis Martins, DPM        Allergies  Allergen Reactions  . Butorphanol Nausea And Vomiting and Other (See Comments)    Sedation   . Codeine Nausea And Vomiting and Other (See Comments)    Sedation     Objective: There were no vitals filed for this visit.  General: No acute distress, AAOx3  Right foot: Sutures and K wires intact with no gapping or dehiscence at surgical site, mild swelling right forefoot, no erythema, no warmth, no drainage, no signs of infection noted, Capillary fill time <3 seconds in all digits, gross sensation present via light touch to right foot however there is subjective numbness to toes as previous. No pain or crepitation with range of motion right foot however there is guarding to surgical sites as previous.  No pain with calf compression.   Assessment and Plan:  Problem List Items Addressed This Visit    None    Visit Diagnoses    Post-operative state    -  Primary   Bunion       Hammer toe, unspecified laterality       Capsulitis       Brachymetatarsia of right foot        Right foot pain          -Patient seen and evaluated -A few more Sutures removed  -Applied dry sterile dressing to surgical site right foot secured with stockinette -Advised patient to make sure to keep dressings clean, dry, and intact to right surgical site -Advised patient to continue with cam boot and nonweightbearing with use of crutches or scooter -Continue with ibuprofen as needed for pain -Advised patient to limit activity to necessity  -Advised patient to ice and elevate 3x per day and take at least 600mg  Motrin a day to help with the inflammation as previous -Will plan for finishing sutures, xray, and possible k-wires removal at next office visit. In the meantime, patient to call office if any issues or problems arise.   Landis Martins, DPM

## 2018-08-27 ENCOUNTER — Ambulatory Visit: Payer: Self-pay

## 2018-08-27 ENCOUNTER — Ambulatory Visit (INDEPENDENT_AMBULATORY_CARE_PROVIDER_SITE_OTHER): Payer: BLUE CROSS/BLUE SHIELD | Admitting: Sports Medicine

## 2018-08-27 ENCOUNTER — Encounter: Payer: Self-pay | Admitting: Sports Medicine

## 2018-08-27 VITALS — BP 136/86 | HR 86 | Temp 98.4°F | Resp 15

## 2018-08-27 DIAGNOSIS — M21619 Bunion of unspecified foot: Secondary | ICD-10-CM

## 2018-08-27 DIAGNOSIS — M2040 Other hammer toe(s) (acquired), unspecified foot: Secondary | ICD-10-CM

## 2018-08-27 DIAGNOSIS — Z9889 Other specified postprocedural states: Secondary | ICD-10-CM

## 2018-08-27 DIAGNOSIS — Q72891 Other reduction defects of right lower limb: Secondary | ICD-10-CM

## 2018-08-27 NOTE — Progress Notes (Signed)
Subjective: Melissa Mcguire is a 57 y.o. female patient seen today in office for POV #4 DOS 07/27/2018, S/P right Altamese [place], second third and fifth hammertoe repair with K wire, for metatarsal osteotomy with elongation secondary to congenitally short fourth metatarsal/brachymet. Patient admits to continued twitches and numbness that is slowly getting better, denies calf pain, denies headache, chest pain, shortness of breath, nausea, vomiting, fever, or chills. No other issues noted.   There are no active problems to display for this patient.   No current outpatient medications on file prior to visit.   Current Facility-Administered Medications on File Prior to Visit  Medication Dose Route Frequency Provider Last Rate Last Dose  . triamcinolone acetonide (KENALOG) 10 MG/ML injection 10 mg  10 mg Other Once Landis Martins, DPM      . triamcinolone acetonide (KENALOG) 10 MG/ML injection 10 mg  10 mg Other Once Landis Martins, DPM        Allergies  Allergen Reactions  . Butorphanol Nausea And Vomiting and Other (See Comments)    Sedation   . Codeine Nausea And Vomiting and Other (See Comments)    Sedation     Objective: There were no vitals filed for this visit.  General: No acute distress, AAOx3  Right foot: Remaining sutures and K wires intact with no gapping or dehiscence at surgical site, mild swelling right forefoot, no erythema, no warmth, no drainage, no signs of infection noted, Capillary fill time <3 seconds in all digits, gross sensation present via light touch to right foot however there is subjective numbness to toes as previous. No pain or crepitation with range of motion right foot however there is guarding to surgical sites as previous.  No pain with calf compression.   Assessment and Plan:  Problem List Items Addressed This Visit    None    Visit Diagnoses    Post-operative state    -  Primary   Bunion       Hammer toe, unspecified laterality       Brachymetatarsia of right foot          -Patient seen and evaluated -All sutures were removed at this visit.  K wires were left in place this visit since the x-ray was down and could not take a picture to see if the pins were ready to be removed -Applied dry sterile dressing to surgical site right foot secured with stockinette -Advised patient to make sure to keep dressings clean, dry, and intact to right surgical site -Advised patient to continue with cam boot and nonweightbearing with use of crutches or scooter -Continue with ibuprofen as needed for pain -Advised patient to limit activity to necessity  -Advised patient to ice and elevate 3x per day and take at least 600mg  Motrin a day to help with the inflammation as previous -Will plan for xray, and k-wires removal at next office visit. In the meantime, patient to call office if any issues or problems arise.   Landis Martins, DPM

## 2018-09-03 ENCOUNTER — Ambulatory Visit (INDEPENDENT_AMBULATORY_CARE_PROVIDER_SITE_OTHER): Payer: BLUE CROSS/BLUE SHIELD

## 2018-09-03 ENCOUNTER — Ambulatory Visit (INDEPENDENT_AMBULATORY_CARE_PROVIDER_SITE_OTHER): Payer: BLUE CROSS/BLUE SHIELD | Admitting: Sports Medicine

## 2018-09-03 ENCOUNTER — Encounter: Payer: Self-pay | Admitting: Sports Medicine

## 2018-09-03 DIAGNOSIS — M779 Enthesopathy, unspecified: Secondary | ICD-10-CM

## 2018-09-03 DIAGNOSIS — M2040 Other hammer toe(s) (acquired), unspecified foot: Secondary | ICD-10-CM

## 2018-09-03 DIAGNOSIS — Q72891 Other reduction defects of right lower limb: Secondary | ICD-10-CM

## 2018-09-03 DIAGNOSIS — Z9889 Other specified postprocedural states: Secondary | ICD-10-CM

## 2018-09-03 DIAGNOSIS — M21619 Bunion of unspecified foot: Secondary | ICD-10-CM

## 2018-09-03 DIAGNOSIS — M79671 Pain in right foot: Secondary | ICD-10-CM

## 2018-09-03 NOTE — Progress Notes (Signed)
Subjective: Melissa Mcguire is a 57 y.o. female patient seen today in office for POV #5 DOS 07/27/2018, S/P right Altamese [place], second third and fifth hammertoe repair with K wire, for metatarsal osteotomy with elongation secondary to congenitally short fourth metatarsal/brachymet. Patient admits to continued twitches and numbness that is slowly getting better but no pain, denies calf pain, denies headache, chest pain, shortness of breath, nausea, vomiting, fever, or chills. No other issues noted.   There are no active problems to display for this patient.   No current outpatient medications on file prior to visit.   Current Facility-Administered Medications on File Prior to Visit  Medication Dose Route Frequency Provider Last Rate Last Dose  . triamcinolone acetonide (KENALOG) 10 MG/ML injection 10 mg  10 mg Other Once Landis Martins, DPM      . triamcinolone acetonide (KENALOG) 10 MG/ML injection 10 mg  10 mg Other Once Landis Martins, DPM        Allergies  Allergen Reactions  . Butorphanol Nausea And Vomiting and Other (See Comments)    Sedation   . Codeine Nausea And Vomiting and Other (See Comments)    Sedation     Objective: There were no vitals filed for this visit.  General: No acute distress, AAOx3  Right foot: Remaining K wires intact with no gapping or dehiscence at surgical site, mild swelling right forefoot, no erythema, no warmth, no drainage, no signs of infection noted, Capillary fill time <3 seconds in all digits, gross sensation present via light touch to right foot however there is subjective numbness to toes as previous. No pain or crepitation with range of motion right foot however there is guarding to surgical sites as previous.  No pain with calf compression.   Assessment and Plan:  Problem List Items Addressed This Visit    None    Visit Diagnoses    Post-operative state    -  Primary   Relevant Orders   DG Foot Complete Right   Bunion       Hammer toe, unspecified laterality       Brachymetatarsia of right foot       Capsulitis       Right foot pain          -Patient seen and evaluated -K wires were removed -Applied dry sterile dressing to surgical site right foot secured with stockinette -Advised patient to remove dressings on tomorrow and may shower -Advised patient to a partial weight-bear to heal only for short distance and shower however still continue with scooters for longer distances -Continue with ibuprofen as needed for pain -Advised patient to limit activity to necessity  -Advised patient to ice and elevate 3x per day and take at least 600mg  Motrin a day to help with the inflammation as previous -Will plan for xray and allowing patient to walk with full pressure without scooter next office visit. In the meantime, patient to call office if any issues or problems arise.   Landis Martins, DPM

## 2018-09-10 ENCOUNTER — Other Ambulatory Visit: Payer: Self-pay | Admitting: Sports Medicine

## 2018-09-14 ENCOUNTER — Other Ambulatory Visit: Payer: Self-pay | Admitting: Sports Medicine

## 2018-09-14 DIAGNOSIS — M21619 Bunion of unspecified foot: Secondary | ICD-10-CM

## 2018-09-14 DIAGNOSIS — Z9889 Other specified postprocedural states: Secondary | ICD-10-CM

## 2018-09-14 DIAGNOSIS — M2040 Other hammer toe(s) (acquired), unspecified foot: Secondary | ICD-10-CM

## 2018-09-14 DIAGNOSIS — M779 Enthesopathy, unspecified: Secondary | ICD-10-CM

## 2018-09-16 NOTE — Progress Notes (Signed)
Date of surgery 07/27/2018 Right foot bunionectomy with internal screw fixation, hammertoe repair digits 2 3 and 5 right foot with K wires, corticotomy/lengthening of fourth metatarsal with internal fixation K wire and bone graft

## 2018-09-17 ENCOUNTER — Encounter: Payer: Self-pay | Admitting: Sports Medicine

## 2018-09-17 ENCOUNTER — Ambulatory Visit (INDEPENDENT_AMBULATORY_CARE_PROVIDER_SITE_OTHER): Payer: BLUE CROSS/BLUE SHIELD

## 2018-09-17 ENCOUNTER — Ambulatory Visit (INDEPENDENT_AMBULATORY_CARE_PROVIDER_SITE_OTHER): Payer: BLUE CROSS/BLUE SHIELD | Admitting: Sports Medicine

## 2018-09-17 ENCOUNTER — Other Ambulatory Visit: Payer: Self-pay | Admitting: Sports Medicine

## 2018-09-17 VITALS — BP 146/83 | HR 79 | Temp 97.4°F | Resp 16

## 2018-09-17 DIAGNOSIS — Z9889 Other specified postprocedural states: Secondary | ICD-10-CM

## 2018-09-17 DIAGNOSIS — Q72891 Other reduction defects of right lower limb: Secondary | ICD-10-CM | POA: Diagnosis not present

## 2018-09-17 DIAGNOSIS — M79671 Pain in right foot: Secondary | ICD-10-CM

## 2018-09-17 DIAGNOSIS — M2040 Other hammer toe(s) (acquired), unspecified foot: Secondary | ICD-10-CM | POA: Diagnosis not present

## 2018-09-17 DIAGNOSIS — M779 Enthesopathy, unspecified: Secondary | ICD-10-CM

## 2018-09-17 DIAGNOSIS — M21619 Bunion of unspecified foot: Secondary | ICD-10-CM

## 2018-09-17 NOTE — Progress Notes (Signed)
DOS: 07-27-2018 Right Bunionectomy with internal screw fixation Hammertoe Repair 2,3,5 Rt with K wires Corticotomy/ lengthening of 4th metatarsal with internal fixation and K wire bone graft  [facility]

## 2018-09-17 NOTE — Progress Notes (Signed)
Subjective: Melissa Mcguire is a 57 y.o. female patient seen today in office for POV #6 DOS 07/27/2018, S/P right Altamese [place], second third and fifth hammertoe repair with K wire, for metatarsal osteotomy with elongation secondary to congenitally short fourth metatarsal/brachymet. Patient admits to continued twitches and numbness that is slowly getting better but no pain, denies calf pain, denies headache, chest pain, shortness of breath, nausea, vomiting, fever, or chills. No other issues noted.   There are no active problems to display for this patient.   No current outpatient medications on file prior to visit.   Current Facility-Administered Medications on File Prior to Visit  Medication Dose Route Frequency Provider Last Rate Last Dose  . triamcinolone acetonide (KENALOG) 10 MG/ML injection 10 mg  10 mg Other Once Landis Martins, DPM      . triamcinolone acetonide (KENALOG) 10 MG/ML injection 10 mg  10 mg Other Once Landis Martins, DPM        Allergies  Allergen Reactions  . Butorphanol Nausea And Vomiting and Other (See Comments)    Sedation   . Codeine Nausea And Vomiting and Other (See Comments)    Sedation     Objective: There were no vitals filed for this visit.  General: No acute distress, AAOx3  Right foot: Surgical site healing well, mild swelling right forefoot, no erythema, no warmth, no drainage, no signs of infection noted, Capillary fill time <3 seconds in all digits, gross sensation present via light touch to right foot however there is subjective numbness to toes as previous. No pain or crepitation with range of motion right foot however there is guarding to surgical sites as previous.  No pain with calf compression.   X-ray right foot consistent with postop status  Assessment and Plan:  Problem List Items Addressed This Visit    None    Visit Diagnoses    Post-operative state    -  Primary   Relevant Orders   DG Foot Complete Right   Bunion       Relevant Orders   DG Foot Complete Right   Hammer toe, unspecified laterality       Relevant Orders   DG Foot Complete Right   Brachymetatarsia of right foot       Capsulitis       Right foot pain          -Patient seen and evaluated -X-rays reviewed -Advised patient now may walk completely full pressure using cam boot for the next 2 weeks -Dispensed surgitube compression sleeve to use when ambulating to assist with edema control -Continue with ibuprofen as needed for pain -Advised patient to limit activity to necessity  -Advised patient to ice and elevate 3x per day and take Motrin as needed for any additional pain with weightbearing or inflammation -Will plan for transition patient to postop shoe at next office visit. In the meantime, patient to call office if any issues or problems arise.   Landis Martins, DPM

## 2018-09-25 DIAGNOSIS — G4733 Obstructive sleep apnea (adult) (pediatric): Secondary | ICD-10-CM | POA: Diagnosis not present

## 2018-09-30 DIAGNOSIS — G4733 Obstructive sleep apnea (adult) (pediatric): Secondary | ICD-10-CM | POA: Diagnosis not present

## 2018-10-01 ENCOUNTER — Ambulatory Visit (INDEPENDENT_AMBULATORY_CARE_PROVIDER_SITE_OTHER): Payer: BLUE CROSS/BLUE SHIELD | Admitting: Sports Medicine

## 2018-10-01 ENCOUNTER — Encounter: Payer: Self-pay | Admitting: Sports Medicine

## 2018-10-01 VITALS — BP 141/72 | HR 84 | Temp 97.2°F | Resp 16

## 2018-10-01 DIAGNOSIS — M79671 Pain in right foot: Secondary | ICD-10-CM

## 2018-10-01 DIAGNOSIS — Z9889 Other specified postprocedural states: Secondary | ICD-10-CM

## 2018-10-01 DIAGNOSIS — Q72891 Other reduction defects of right lower limb: Secondary | ICD-10-CM

## 2018-10-01 DIAGNOSIS — M779 Enthesopathy, unspecified: Secondary | ICD-10-CM

## 2018-10-01 DIAGNOSIS — M2040 Other hammer toe(s) (acquired), unspecified foot: Secondary | ICD-10-CM

## 2018-10-01 DIAGNOSIS — M21619 Bunion of unspecified foot: Secondary | ICD-10-CM

## 2018-10-01 DIAGNOSIS — E669 Obesity, unspecified: Secondary | ICD-10-CM | POA: Insufficient documentation

## 2018-10-01 NOTE — Progress Notes (Signed)
Subjective: Melissa Mcguire is a 57 y.o. female patient seen today in office for POV #7 DOS 07/27/2018, S/P right Altamese [place], second third and fifth hammertoe repair with K wire, for metatarsal osteotomy with elongation secondary to congenitally short fourth metatarsal/brachymet. Patient states that she is doing good every once in a while has a little bit of soreness but otherwise it is fine denies calf pain, denies headache, chest pain, shortness of breath, nausea, vomiting, fever, or chills. No other issues noted.   Patient Active Problem List   Diagnosis Date Noted  . Obesity with body mass index 30 or greater 10/01/2018    Current Outpatient Medications on File Prior to Visit  Medication Sig Dispense Refill  . ibuprofen (ADVIL,MOTRIN) 800 MG tablet 200 mg.   0   Current Facility-Administered Medications on File Prior to Visit  Medication Dose Route Frequency Provider Last Rate Last Dose  . triamcinolone acetonide (KENALOG) 10 MG/ML injection 10 mg  10 mg Other Once Landis Martins, DPM      . triamcinolone acetonide (KENALOG) 10 MG/ML injection 10 mg  10 mg Other Once Landis Martins, DPM        Allergies  Allergen Reactions  . Butorphanol Nausea And Vomiting and Other (See Comments)    Sedation   . Codeine Nausea And Vomiting and Other (See Comments)    Sedation     Objective: There were no vitals filed for this visit.  General: No acute distress, AAOx3  Right foot: Surgical site well-healed with a small area of scab at the proximal fourth ray incision with no signs of infection, mild swelling right forefoot, no erythema, no warmth, no drainage, no signs of infection noted, Capillary fill time <3 seconds in all digits, gross sensation present via light touch to right foot however there is subjective numbness still remaining to the right fourth toe. No pain or crepitation with range of motion right foot however there is guarding to surgical sites as previous.  No pain with  calf compression.   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 In the meantime, patient to call office if any issues or problems arise.   Landis Martins, DPM

## 2018-10-04 DIAGNOSIS — G4733 Obstructive sleep apnea (adult) (pediatric): Secondary | ICD-10-CM | POA: Diagnosis not present

## 2018-10-15 ENCOUNTER — Ambulatory Visit (INDEPENDENT_AMBULATORY_CARE_PROVIDER_SITE_OTHER): Payer: BLUE CROSS/BLUE SHIELD

## 2018-10-15 ENCOUNTER — Other Ambulatory Visit: Payer: Self-pay | Admitting: Sports Medicine

## 2018-10-15 ENCOUNTER — Ambulatory Visit (INDEPENDENT_AMBULATORY_CARE_PROVIDER_SITE_OTHER): Payer: BLUE CROSS/BLUE SHIELD | Admitting: Sports Medicine

## 2018-10-15 ENCOUNTER — Encounter: Payer: Self-pay | Admitting: Sports Medicine

## 2018-10-15 VITALS — BP 139/84 | HR 75 | Temp 97.7°F | Resp 16

## 2018-10-15 DIAGNOSIS — M779 Enthesopathy, unspecified: Secondary | ICD-10-CM

## 2018-10-15 DIAGNOSIS — Q72891 Other reduction defects of right lower limb: Secondary | ICD-10-CM

## 2018-10-15 DIAGNOSIS — M2041 Other hammer toe(s) (acquired), right foot: Secondary | ICD-10-CM

## 2018-10-15 DIAGNOSIS — Z9889 Other specified postprocedural states: Secondary | ICD-10-CM

## 2018-10-15 DIAGNOSIS — M21619 Bunion of unspecified foot: Secondary | ICD-10-CM

## 2018-10-15 DIAGNOSIS — M2040 Other hammer toe(s) (acquired), unspecified foot: Secondary | ICD-10-CM

## 2018-10-15 DIAGNOSIS — M79671 Pain in right foot: Secondary | ICD-10-CM

## 2018-10-15 NOTE — Progress Notes (Signed)
Subjective: Melissa Mcguire is a 57 y.o. female patient seen today in office for POV #8 DOS 07/27/2018, S/P right Altamese [place], second third and fifth hammertoe repair with K wire, for metatarsal osteotomy with elongation secondary to congenitally short fourth metatarsal/brachymet. Patient states that sometimes it is sore and swollen when she does a lot of standing or walking but otherwise she is doing fine, denies calf pain, denies headache, chest pain, shortness of breath, nausea, vomiting, fever, or chills. No other issues noted.   Patient Active Problem List   Diagnosis Date Noted  . Obesity with body mass index 30 or greater 10/01/2018    Current Outpatient Medications on File Prior to Visit  Medication Sig Dispense Refill  . ibuprofen (ADVIL,MOTRIN) 800 MG tablet 200 mg.   0   Current Facility-Administered Medications on File Prior to Visit  Medication Dose Route Frequency Provider Last Rate Last Dose  . triamcinolone acetonide (KENALOG) 10 MG/ML injection 10 mg  10 mg Other Once Landis Martins, DPM      . triamcinolone acetonide (KENALOG) 10 MG/ML injection 10 mg  10 mg Other Once Landis Martins, DPM        Allergies  Allergen Reactions  . Butorphanol Nausea And Vomiting and Other (See Comments)    Sedation   . Codeine Nausea And Vomiting and Other (See Comments)    Sedation     Objective: There were no vitals filed for this visit.  General: No acute distress, AAOx3  Right foot: Surgical sites well-healed, mild swelling right forefoot, no erythema, no warmth, no drainage, no signs of infection noted, Capillary fill time <3 seconds in all digits, gross sensation present via light touch to right foot however there is subjective hypersensitivity to the right fourth toe. No pain or crepitation with range of motion right foot however there is mild guarding to surgical sites as previous.  No pain with calf compression.   X-rays consistent with postop status no other acute  findings.  Assessment and Plan:  Problem List Items Addressed This Visit    None    Visit Diagnoses    Post-operative state    -  Primary   Relevant Orders   DG Foot Complete Right   Bunion       Hammer toe, unspecified laterality       Brachymetatarsia of right foot       Capsulitis       Right foot pain          -Patient seen and evaluated -Xrays reviewed -Continue with toe splint -Patient may transition to good supportive tennis shoe and slow increments and may slowly increase activities -Dispense surgical compression sleeve to use when ambulating to assist with edema control -Continue with ibuprofen as needed for pain and inflammation -Advised patient to limit activity to necessity  -Advised patient to ice and elevate 3x per day as needed for any increased episodes of swelling or pain as previously recommended -Will plan for final xray and d/c from post op care at next office visit. In the meantime, patient to call office if any issues or problems arise.   Landis Martins, DPM

## 2018-10-19 NOTE — Progress Notes (Signed)
Patient ID: Melissa Mcguire, female   DOB: Oct 20, 1961, 57 y.o.   MRN: [id number]     Patient presents for orthotic pick up.  Verbal and written break in and wear instructions given.  Patient will follow up with Dr. Cannon Kettle in 4 weeks if symptoms worsen or fail to improve.

## 2018-10-19 NOTE — Patient Instructions (Signed)

## 2018-10-27 DIAGNOSIS — L820 Inflamed seborrheic keratosis: Secondary | ICD-10-CM | POA: Diagnosis not present

## 2018-10-27 DIAGNOSIS — C44319 Basal cell carcinoma of skin of other parts of face: Secondary | ICD-10-CM | POA: Diagnosis not present

## 2018-10-27 DIAGNOSIS — L821 Other seborrheic keratosis: Secondary | ICD-10-CM | POA: Diagnosis not present

## 2018-11-04 DIAGNOSIS — G4733 Obstructive sleep apnea (adult) (pediatric): Secondary | ICD-10-CM | POA: Diagnosis not present

## 2018-11-18 ENCOUNTER — Ambulatory Visit (INDEPENDENT_AMBULATORY_CARE_PROVIDER_SITE_OTHER): Payer: BLUE CROSS/BLUE SHIELD

## 2018-11-18 ENCOUNTER — Ambulatory Visit (INDEPENDENT_AMBULATORY_CARE_PROVIDER_SITE_OTHER): Payer: BLUE CROSS/BLUE SHIELD | Admitting: Sports Medicine

## 2018-11-18 ENCOUNTER — Encounter: Payer: Self-pay | Admitting: Sports Medicine

## 2018-11-18 VITALS — BP 120/69 | HR 87 | Temp 97.8°F | Resp 16

## 2018-11-18 DIAGNOSIS — M2041 Other hammer toe(s) (acquired), right foot: Secondary | ICD-10-CM | POA: Diagnosis not present

## 2018-11-18 DIAGNOSIS — M21619 Bunion of unspecified foot: Secondary | ICD-10-CM | POA: Diagnosis not present

## 2018-11-18 DIAGNOSIS — M779 Enthesopathy, unspecified: Secondary | ICD-10-CM

## 2018-11-18 DIAGNOSIS — Q72891 Other reduction defects of right lower limb: Secondary | ICD-10-CM | POA: Diagnosis not present

## 2018-11-18 DIAGNOSIS — M2040 Other hammer toe(s) (acquired), unspecified foot: Secondary | ICD-10-CM

## 2018-11-18 DIAGNOSIS — M79671 Pain in right foot: Secondary | ICD-10-CM

## 2018-11-18 DIAGNOSIS — Z9889 Other specified postprocedural states: Secondary | ICD-10-CM

## 2018-11-18 NOTE — Progress Notes (Signed)
Subjective: Melissa Mcguire is a 57 y.o. female patient seen today in office for POV # 9 DOS 07/27/2018, S/P right Altamese [place], second third and fifth hammertoe repair with K wire, for metatarsal osteotomy with elongation secondary to congenitally short fourth metatarsal/brachymet. Patient states that sometimes it is sore and swollen when she does a lot of standing or walking but otherwise she is doing fine pain is worse when she has done a lot of is 3-4 out of 10, denies calf pain, denies headache, chest pain, shortness of breath, nausea, vomiting, fever, or chills. No other issues noted.   Patient Active Problem List   Diagnosis Date Noted  . Obesity with body mass index 30 or greater 10/01/2018    Current Outpatient Medications on File Prior to Visit  Medication Sig Dispense Refill  . ibuprofen (ADVIL,MOTRIN) 800 MG tablet 200 mg.   0   Current Facility-Administered Medications on File Prior to Visit  Medication Dose Route Frequency Provider Last Rate Last Dose  . triamcinolone acetonide (KENALOG) 10 MG/ML injection 10 mg  10 mg Other Once Landis Martins, DPM      . triamcinolone acetonide (KENALOG) 10 MG/ML injection 10 mg  10 mg Other Once Landis Martins, DPM        Allergies  Allergen Reactions  . Butorphanol Nausea And Vomiting and Other (See Comments)    Sedation   . Codeine Nausea And Vomiting and Other (See Comments)    Sedation     Objective: There were no vitals filed for this visit.  General: No acute distress, AAOx3  Right foot: Surgical sites well-healed, minimal swelling right forefoot, no erythema, no warmth, no drainage, no signs of infection noted, Capillary fill time <3 seconds in all digits, gross sensation present via light touch to right foot however there is subjective hypersensitivity to the right fourth toe from time to time and stiffness with trying to bend toes. No pain or crepitation with range of motion right foot. No pain with calf compression.    X-rays consistent with postop status sites well-healed no other acute findings.  Assessment and Plan:  Problem List Items Addressed This Visit    None    Visit Diagnoses    Post-operative state    -  Primary   Relevant Orders   DG Foot Complete Right   Hammer toe, unspecified laterality       Relevant Orders   DG Foot Complete Right   Bunion       Brachymetatarsia of right foot       Capsulitis       Right foot pain          -Patient seen and evaluated -Xrays reviewed -Continue with toe splint for 1 month -Continue with good supportive shoes and activities to tolerance -Apply a metatarsal pad to right shoe -Advised patient to ice and elevate as necessary and occasional Motrin if needed -Will plan check of orthotics in 6 weeks to see if she can start wearing them again at next office visit. In the meantime, patient to call office if any issues or problems arise.   Landis Martins, DPM

## 2018-11-26 ENCOUNTER — Other Ambulatory Visit: Payer: Self-pay | Admitting: Sports Medicine

## 2018-11-26 DIAGNOSIS — Z9889 Other specified postprocedural states: Secondary | ICD-10-CM

## 2018-11-26 DIAGNOSIS — Q72891 Other reduction defects of right lower limb: Secondary | ICD-10-CM

## 2018-11-26 DIAGNOSIS — M2040 Other hammer toe(s) (acquired), unspecified foot: Secondary | ICD-10-CM

## 2018-11-26 DIAGNOSIS — M779 Enthesopathy, unspecified: Secondary | ICD-10-CM

## 2018-12-04 DIAGNOSIS — G4733 Obstructive sleep apnea (adult) (pediatric): Secondary | ICD-10-CM | POA: Diagnosis not present

## 2018-12-11 DIAGNOSIS — H5203 Hypermetropia, bilateral: Secondary | ICD-10-CM | POA: Diagnosis not present

## 2018-12-22 DIAGNOSIS — Z1231 Encounter for screening mammogram for malignant neoplasm of breast: Secondary | ICD-10-CM | POA: Diagnosis not present

## 2018-12-22 DIAGNOSIS — Z6834 Body mass index (BMI) 34.0-34.9, adult: Secondary | ICD-10-CM | POA: Diagnosis not present

## 2018-12-22 DIAGNOSIS — Z01419 Encounter for gynecological examination (general) (routine) without abnormal findings: Secondary | ICD-10-CM | POA: Diagnosis not present

## 2018-12-30 ENCOUNTER — Encounter: Payer: Self-pay | Admitting: Sports Medicine

## 2018-12-30 ENCOUNTER — Ambulatory Visit (INDEPENDENT_AMBULATORY_CARE_PROVIDER_SITE_OTHER): Payer: BLUE CROSS/BLUE SHIELD | Admitting: Sports Medicine

## 2018-12-30 VITALS — BP 127/90 | HR 86 | Temp 97.7°F | Resp 16

## 2018-12-30 DIAGNOSIS — M21619 Bunion of unspecified foot: Secondary | ICD-10-CM | POA: Diagnosis not present

## 2018-12-30 DIAGNOSIS — L603 Nail dystrophy: Secondary | ICD-10-CM

## 2018-12-30 DIAGNOSIS — Q72891 Other reduction defects of right lower limb: Secondary | ICD-10-CM | POA: Diagnosis not present

## 2018-12-30 DIAGNOSIS — M779 Enthesopathy, unspecified: Secondary | ICD-10-CM

## 2018-12-30 DIAGNOSIS — Z9889 Other specified postprocedural states: Secondary | ICD-10-CM

## 2018-12-30 DIAGNOSIS — L299 Pruritus, unspecified: Secondary | ICD-10-CM

## 2018-12-30 DIAGNOSIS — M79671 Pain in right foot: Secondary | ICD-10-CM

## 2018-12-30 DIAGNOSIS — M2040 Other hammer toe(s) (acquired), unspecified foot: Secondary | ICD-10-CM

## 2018-12-30 NOTE — Progress Notes (Signed)
Subjective: Melissa Mcguire is a 58 y.o. female patient seen today in office for POV # 10 DOS 07/27/2018, S/P right Altamese [place], second third and fifth hammertoe repair with K wire, for metatarsal osteotomy with elongation secondary to congenitally short fourth metatarsal/brachymet. Patient denies pain but states that she has noticed her nails becoming thick and discolored and a little itching between the fourth and fifth toes and twinges every now and again, denies calf pain, denies headache, chest pain, shortness of breath, nausea, vomiting, fever, or chills. No other issues noted.   Patient Active Problem List   Diagnosis Date Noted  . Obesity with body mass index 30 or greater 10/01/2018    Current Outpatient Medications on File Prior to Visit  Medication Sig Dispense Refill  . ibuprofen (ADVIL,MOTRIN) 800 MG tablet 200 mg.   0   Current Facility-Administered Medications on File Prior to Visit  Medication Dose Route Frequency Provider Last Rate Last Dose  . triamcinolone acetonide (KENALOG) 10 MG/ML injection 10 mg  10 mg Other Once Landis Martins, DPM      . triamcinolone acetonide (KENALOG) 10 MG/ML injection 10 mg  10 mg Other Once Landis Martins, DPM        Allergies  Allergen Reactions  . Butorphanol Nausea And Vomiting and Other (See Comments)    Sedation   . Codeine Nausea And Vomiting and Other (See Comments)    Sedation     Objective: There were no vitals filed for this visit.  General: No acute distress, AAOx3  Right foot: Surgical sites well-healed, there are small little trauma ridges likely from her nail stopping growing while recovering from surgery or effects of medication with no signs of infection on lesser toenails, no swelling right forefoot, no erythema, no warmth, no drainage, no signs of infection noted, no rash, Capillary fill time <3 seconds in all digits, gross sensation present via light touch to right foot however there is subjective  hypersensitivity to the right fourth toe from time to time and stiffness with trying to bend toes like before that is getting better and subjective twinges and itchy sensation at 4-5 toes. No pain or crepitation with range of motion right foot. No pain with calf compression.    Assessment and Plan:  Problem List Items Addressed This Visit    None    Visit Diagnoses    Post-operative state    -  Primary   Hammer toe, unspecified laterality       Bunion       Brachymetatarsia of right foot       Capsulitis       Right foot pain       Nail dystrophy       Itchy skin          -Patient seen and evaluated -Evaluated current orthotics and advised patient to resume wearing them slowly to break them back in  -Continue with good supportive shoes and activities to tolerance -May discontinue toe splint -Encourage continue with range of motion and gentle massage of surgical scars to prevent scar contracture -Advised patient that small ridges in her toenails will continue to grow out and are benign -Advised patient to continue with cortisone cream in areas it feels itchy over the incisions and advised patient that this could also be a reaction of her nerves being hypersensitive however if the itchy sensation continues can switch to using Tinactin spray over-the-counter and if continues beyond 2 weeks to return to office for  reevaluation -Advised patient to ice and elevate as necessary and occasional Motrin if needed -Return PRN.In the meantime, patient to call office if any issues or problems arise.   Landis Martins, DPM

## 2019-01-04 DIAGNOSIS — G4733 Obstructive sleep apnea (adult) (pediatric): Secondary | ICD-10-CM | POA: Diagnosis not present

## 2019-01-18 DIAGNOSIS — R079 Chest pain, unspecified: Secondary | ICD-10-CM | POA: Diagnosis not present

## 2019-01-18 DIAGNOSIS — Z6832 Body mass index (BMI) 32.0-32.9, adult: Secondary | ICD-10-CM | POA: Diagnosis not present

## 2019-02-04 DIAGNOSIS — G4733 Obstructive sleep apnea (adult) (pediatric): Secondary | ICD-10-CM | POA: Diagnosis not present

## 2019-03-05 DIAGNOSIS — G4733 Obstructive sleep apnea (adult) (pediatric): Secondary | ICD-10-CM | POA: Diagnosis not present

## 2019-04-05 DIAGNOSIS — G4733 Obstructive sleep apnea (adult) (pediatric): Secondary | ICD-10-CM | POA: Diagnosis not present

## 2019-05-05 DIAGNOSIS — G4733 Obstructive sleep apnea (adult) (pediatric): Secondary | ICD-10-CM | POA: Diagnosis not present

## 2019-06-05 DIAGNOSIS — G4733 Obstructive sleep apnea (adult) (pediatric): Secondary | ICD-10-CM | POA: Diagnosis not present

## 2019-06-14 DIAGNOSIS — L578 Other skin changes due to chronic exposure to nonionizing radiation: Secondary | ICD-10-CM | POA: Diagnosis not present

## 2019-06-14 DIAGNOSIS — L720 Epidermal cyst: Secondary | ICD-10-CM | POA: Diagnosis not present

## 2019-06-14 DIAGNOSIS — D485 Neoplasm of uncertain behavior of skin: Secondary | ICD-10-CM | POA: Diagnosis not present

## 2019-07-05 DIAGNOSIS — G4733 Obstructive sleep apnea (adult) (pediatric): Secondary | ICD-10-CM | POA: Diagnosis not present

## 2019-08-05 DIAGNOSIS — G4733 Obstructive sleep apnea (adult) (pediatric): Secondary | ICD-10-CM | POA: Diagnosis not present

## 2019-10-19 DIAGNOSIS — G4733 Obstructive sleep apnea (adult) (pediatric): Secondary | ICD-10-CM | POA: Diagnosis not present

## 2020-01-10 DIAGNOSIS — Z1231 Encounter for screening mammogram for malignant neoplasm of breast: Secondary | ICD-10-CM | POA: Diagnosis not present

## 2020-01-10 DIAGNOSIS — Z0000 Encounter for general adult medical examination without abnormal findings: Secondary | ICD-10-CM | POA: Diagnosis not present

## 2020-01-10 DIAGNOSIS — Z01419 Encounter for gynecological examination (general) (routine) without abnormal findings: Secondary | ICD-10-CM | POA: Diagnosis not present

## 2020-01-10 DIAGNOSIS — Z6833 Body mass index (BMI) 33.0-33.9, adult: Secondary | ICD-10-CM | POA: Diagnosis not present

## 2020-03-17 DIAGNOSIS — Z03818 Encounter for observation for suspected exposure to other biological agents ruled out: Secondary | ICD-10-CM | POA: Diagnosis not present

## 2020-06-15 DIAGNOSIS — D225 Melanocytic nevi of trunk: Secondary | ICD-10-CM | POA: Diagnosis not present

## 2020-06-15 DIAGNOSIS — D485 Neoplasm of uncertain behavior of skin: Secondary | ICD-10-CM | POA: Diagnosis not present

## 2020-06-15 DIAGNOSIS — L820 Inflamed seborrheic keratosis: Secondary | ICD-10-CM | POA: Diagnosis not present

## 2020-08-02 DIAGNOSIS — Z6833 Body mass index (BMI) 33.0-33.9, adult: Secondary | ICD-10-CM | POA: Diagnosis not present

## 2020-08-02 DIAGNOSIS — R2241 Localized swelling, mass and lump, right lower limb: Secondary | ICD-10-CM | POA: Diagnosis not present

## 2020-08-04 DIAGNOSIS — R2241 Localized swelling, mass and lump, right lower limb: Secondary | ICD-10-CM | POA: Diagnosis not present

## 2020-08-04 DIAGNOSIS — R936 Abnormal findings on diagnostic imaging of limbs: Secondary | ICD-10-CM | POA: Diagnosis not present

## 2020-08-29 DIAGNOSIS — Z20828 Contact with and (suspected) exposure to other viral communicable diseases: Secondary | ICD-10-CM | POA: Diagnosis not present

## 2020-08-30 DIAGNOSIS — R070 Pain in throat: Secondary | ICD-10-CM | POA: Diagnosis not present

## 2020-08-30 DIAGNOSIS — J019 Acute sinusitis, unspecified: Secondary | ICD-10-CM | POA: Diagnosis not present

## 2020-08-30 DIAGNOSIS — K120 Recurrent oral aphthae: Secondary | ICD-10-CM | POA: Diagnosis not present

## 2020-10-19 DIAGNOSIS — G4733 Obstructive sleep apnea (adult) (pediatric): Secondary | ICD-10-CM | POA: Diagnosis not present

## 2020-10-31 DIAGNOSIS — G4733 Obstructive sleep apnea (adult) (pediatric): Secondary | ICD-10-CM | POA: Diagnosis not present

## 2020-11-22 DIAGNOSIS — H8110 Benign paroxysmal vertigo, unspecified ear: Secondary | ICD-10-CM | POA: Diagnosis not present

## 2020-11-22 DIAGNOSIS — Z6833 Body mass index (BMI) 33.0-33.9, adult: Secondary | ICD-10-CM | POA: Diagnosis not present

## 2020-11-28 DIAGNOSIS — H8110 Benign paroxysmal vertigo, unspecified ear: Secondary | ICD-10-CM | POA: Diagnosis not present

## 2020-11-30 DIAGNOSIS — H8110 Benign paroxysmal vertigo, unspecified ear: Secondary | ICD-10-CM | POA: Diagnosis not present

## 2020-12-05 DIAGNOSIS — H8110 Benign paroxysmal vertigo, unspecified ear: Secondary | ICD-10-CM | POA: Diagnosis not present

## 2020-12-11 DIAGNOSIS — Z03818 Encounter for observation for suspected exposure to other biological agents ruled out: Secondary | ICD-10-CM | POA: Diagnosis not present

## 2020-12-14 DIAGNOSIS — H8110 Benign paroxysmal vertigo, unspecified ear: Secondary | ICD-10-CM | POA: Diagnosis not present

## 2020-12-18 DIAGNOSIS — H8110 Benign paroxysmal vertigo, unspecified ear: Secondary | ICD-10-CM | POA: Diagnosis not present

## 2021-01-03 DIAGNOSIS — H8110 Benign paroxysmal vertigo, unspecified ear: Secondary | ICD-10-CM | POA: Diagnosis not present

## 2021-01-11 DIAGNOSIS — Z01419 Encounter for gynecological examination (general) (routine) without abnormal findings: Secondary | ICD-10-CM | POA: Diagnosis not present

## 2021-01-11 DIAGNOSIS — H8110 Benign paroxysmal vertigo, unspecified ear: Secondary | ICD-10-CM | POA: Diagnosis not present

## 2021-01-11 DIAGNOSIS — Z1231 Encounter for screening mammogram for malignant neoplasm of breast: Secondary | ICD-10-CM | POA: Diagnosis not present

## 2021-01-11 DIAGNOSIS — Z6833 Body mass index (BMI) 33.0-33.9, adult: Secondary | ICD-10-CM | POA: Diagnosis not present

## 2021-01-16 DIAGNOSIS — H5203 Hypermetropia, bilateral: Secondary | ICD-10-CM | POA: Diagnosis not present

## 2021-01-18 DIAGNOSIS — M6281 Muscle weakness (generalized): Secondary | ICD-10-CM | POA: Diagnosis not present

## 2021-01-18 DIAGNOSIS — R2681 Unsteadiness on feet: Secondary | ICD-10-CM | POA: Diagnosis not present

## 2021-01-18 DIAGNOSIS — R2689 Other abnormalities of gait and mobility: Secondary | ICD-10-CM | POA: Diagnosis not present

## 2021-01-18 DIAGNOSIS — G4489 Other headache syndrome: Secondary | ICD-10-CM | POA: Diagnosis not present

## 2021-01-18 DIAGNOSIS — H8110 Benign paroxysmal vertigo, unspecified ear: Secondary | ICD-10-CM | POA: Diagnosis not present

## 2021-01-24 DIAGNOSIS — Z2821 Immunization not carried out because of patient refusal: Secondary | ICD-10-CM | POA: Diagnosis not present

## 2021-01-24 DIAGNOSIS — Z13220 Encounter for screening for lipoid disorders: Secondary | ICD-10-CM | POA: Diagnosis not present

## 2021-01-24 DIAGNOSIS — Z6834 Body mass index (BMI) 34.0-34.9, adult: Secondary | ICD-10-CM | POA: Diagnosis not present

## 2021-01-24 DIAGNOSIS — M8580 Other specified disorders of bone density and structure, unspecified site: Secondary | ICD-10-CM | POA: Diagnosis not present

## 2021-01-24 DIAGNOSIS — Z1331 Encounter for screening for depression: Secondary | ICD-10-CM | POA: Diagnosis not present

## 2021-01-24 DIAGNOSIS — Z0000 Encounter for general adult medical examination without abnormal findings: Secondary | ICD-10-CM | POA: Diagnosis not present

## 2021-04-28 DIAGNOSIS — R1030 Lower abdominal pain, unspecified: Secondary | ICD-10-CM | POA: Diagnosis not present

## 2021-04-28 DIAGNOSIS — R350 Frequency of micturition: Secondary | ICD-10-CM | POA: Diagnosis not present

## 2021-04-28 DIAGNOSIS — N3091 Cystitis, unspecified with hematuria: Secondary | ICD-10-CM | POA: Diagnosis not present

## 2021-04-28 DIAGNOSIS — R300 Dysuria: Secondary | ICD-10-CM | POA: Diagnosis not present

## 2021-04-28 DIAGNOSIS — N3001 Acute cystitis with hematuria: Secondary | ICD-10-CM | POA: Diagnosis not present

## 2021-05-19 DIAGNOSIS — L820 Inflamed seborrheic keratosis: Secondary | ICD-10-CM | POA: Diagnosis not present

## 2021-05-19 DIAGNOSIS — L720 Epidermal cyst: Secondary | ICD-10-CM | POA: Diagnosis not present

## 2021-05-19 DIAGNOSIS — L821 Other seborrheic keratosis: Secondary | ICD-10-CM | POA: Diagnosis not present

## 2021-05-19 DIAGNOSIS — L578 Other skin changes due to chronic exposure to nonionizing radiation: Secondary | ICD-10-CM | POA: Diagnosis not present

## 2021-05-19 DIAGNOSIS — C44712 Basal cell carcinoma of skin of right lower limb, including hip: Secondary | ICD-10-CM | POA: Diagnosis not present

## 2021-06-04 DIAGNOSIS — C44722 Squamous cell carcinoma of skin of right lower limb, including hip: Secondary | ICD-10-CM | POA: Diagnosis not present

## 2021-07-26 DIAGNOSIS — Z20822 Contact with and (suspected) exposure to covid-19: Secondary | ICD-10-CM | POA: Diagnosis not present

## 2021-11-09 DIAGNOSIS — R300 Dysuria: Secondary | ICD-10-CM | POA: Diagnosis not present

## 2021-11-09 DIAGNOSIS — N3091 Cystitis, unspecified with hematuria: Secondary | ICD-10-CM | POA: Diagnosis not present

## 2021-11-13 ENCOUNTER — Other Ambulatory Visit: Payer: Self-pay | Admitting: Obstetrics & Gynecology

## 2021-11-13 DIAGNOSIS — Z1231 Encounter for screening mammogram for malignant neoplasm of breast: Secondary | ICD-10-CM

## 2021-12-15 DIAGNOSIS — G4733 Obstructive sleep apnea (adult) (pediatric): Secondary | ICD-10-CM | POA: Diagnosis not present

## 2022-01-14 ENCOUNTER — Ambulatory Visit: Payer: Self-pay

## 2022-01-14 DIAGNOSIS — Z6834 Body mass index (BMI) 34.0-34.9, adult: Secondary | ICD-10-CM | POA: Diagnosis not present

## 2022-01-14 DIAGNOSIS — Z01419 Encounter for gynecological examination (general) (routine) without abnormal findings: Secondary | ICD-10-CM | POA: Diagnosis not present

## 2022-01-14 DIAGNOSIS — Z0000 Encounter for general adult medical examination without abnormal findings: Secondary | ICD-10-CM | POA: Diagnosis not present

## 2022-01-15 DIAGNOSIS — L300 Nummular dermatitis: Secondary | ICD-10-CM | POA: Diagnosis not present

## 2022-01-15 DIAGNOSIS — L820 Inflamed seborrheic keratosis: Secondary | ICD-10-CM | POA: Diagnosis not present

## 2022-02-15 DIAGNOSIS — Z1231 Encounter for screening mammogram for malignant neoplasm of breast: Secondary | ICD-10-CM | POA: Diagnosis not present

## 2022-02-18 ENCOUNTER — Other Ambulatory Visit: Payer: Self-pay | Admitting: Obstetrics & Gynecology

## 2022-02-18 DIAGNOSIS — R928 Other abnormal and inconclusive findings on diagnostic imaging of breast: Secondary | ICD-10-CM

## 2022-03-04 DIAGNOSIS — Z6833 Body mass index (BMI) 33.0-33.9, adult: Secondary | ICD-10-CM | POA: Diagnosis not present

## 2022-03-04 DIAGNOSIS — I10 Essential (primary) hypertension: Secondary | ICD-10-CM | POA: Diagnosis not present

## 2022-03-11 ENCOUNTER — Ambulatory Visit
Admission: RE | Admit: 2022-03-11 | Discharge: 2022-03-11 | Disposition: A | Discharge status: Home / Self Care | Payer: Self-pay | Source: Ambulatory Visit | Attending: Obstetrics & Gynecology | Admitting: Obstetrics & Gynecology

## 2022-03-11 ENCOUNTER — Ambulatory Visit
Admission: RE | Admit: 2022-03-11 | Discharge: 2022-03-11 | Disposition: A | Discharge status: Home / Self Care | Payer: BC Managed Care – PPO | Source: Ambulatory Visit | Attending: Obstetrics & Gynecology | Admitting: Obstetrics & Gynecology

## 2022-03-11 DIAGNOSIS — H1045 Other chronic allergic conjunctivitis: Secondary | ICD-10-CM | POA: Diagnosis not present

## 2022-03-11 DIAGNOSIS — R928 Other abnormal and inconclusive findings on diagnostic imaging of breast: Secondary | ICD-10-CM

## 2022-03-11 DIAGNOSIS — R922 Inconclusive mammogram: Secondary | ICD-10-CM | POA: Diagnosis not present

## 2022-03-26 ENCOUNTER — Other Ambulatory Visit: Payer: Self-pay | Admitting: Obstetrics & Gynecology

## 2022-03-26 DIAGNOSIS — N6310 Unspecified lump in the right breast, unspecified quadrant: Secondary | ICD-10-CM

## 2022-03-26 DIAGNOSIS — Z09 Encounter for follow-up examination after completed treatment for conditions other than malignant neoplasm: Secondary | ICD-10-CM

## 2022-05-23 DIAGNOSIS — L02212 Cutaneous abscess of back [any part, except buttock]: Secondary | ICD-10-CM | POA: Diagnosis not present

## 2022-05-23 DIAGNOSIS — L578 Other skin changes due to chronic exposure to nonionizing radiation: Secondary | ICD-10-CM | POA: Diagnosis not present

## 2022-05-23 DIAGNOSIS — L820 Inflamed seborrheic keratosis: Secondary | ICD-10-CM | POA: Diagnosis not present

## 2022-05-23 DIAGNOSIS — L821 Other seborrheic keratosis: Secondary | ICD-10-CM | POA: Diagnosis not present

## 2022-06-04 DIAGNOSIS — I10 Essential (primary) hypertension: Secondary | ICD-10-CM | POA: Diagnosis not present

## 2022-06-04 DIAGNOSIS — Z6834 Body mass index (BMI) 34.0-34.9, adult: Secondary | ICD-10-CM | POA: Diagnosis not present

## 2022-09-12 ENCOUNTER — Ambulatory Visit
Admission: RE | Admit: 2022-09-12 | Discharge: 2022-09-12 | Disposition: A | Discharge status: Home / Self Care | Payer: BC Managed Care – PPO | Source: Ambulatory Visit | Attending: Obstetrics & Gynecology | Admitting: Obstetrics & Gynecology

## 2022-09-12 ENCOUNTER — Other Ambulatory Visit: Payer: Self-pay | Admitting: Obstetrics & Gynecology

## 2022-09-12 DIAGNOSIS — N6310 Unspecified lump in the right breast, unspecified quadrant: Secondary | ICD-10-CM

## 2022-09-12 DIAGNOSIS — Z09 Encounter for follow-up examination after completed treatment for conditions other than malignant neoplasm: Secondary | ICD-10-CM

## 2022-09-12 DIAGNOSIS — R928 Other abnormal and inconclusive findings on diagnostic imaging of breast: Secondary | ICD-10-CM | POA: Diagnosis not present

## 2022-09-12 DIAGNOSIS — N6311 Unspecified lump in the right breast, upper outer quadrant: Secondary | ICD-10-CM | POA: Diagnosis not present

## 2022-10-21 DIAGNOSIS — Z6834 Body mass index (BMI) 34.0-34.9, adult: Secondary | ICD-10-CM | POA: Diagnosis not present

## 2022-10-21 DIAGNOSIS — Z2821 Immunization not carried out because of patient refusal: Secondary | ICD-10-CM | POA: Diagnosis not present

## 2022-10-21 DIAGNOSIS — R079 Chest pain, unspecified: Secondary | ICD-10-CM | POA: Diagnosis not present

## 2022-10-24 ENCOUNTER — Other Ambulatory Visit: Payer: Self-pay

## 2022-10-24 DIAGNOSIS — G4730 Sleep apnea, unspecified: Secondary | ICD-10-CM | POA: Insufficient documentation

## 2022-10-24 DIAGNOSIS — Z905 Acquired absence of kidney: Secondary | ICD-10-CM | POA: Insufficient documentation

## 2022-10-24 DIAGNOSIS — H8110 Benign paroxysmal vertigo, unspecified ear: Secondary | ICD-10-CM | POA: Insufficient documentation

## 2022-11-04 ENCOUNTER — Encounter: Payer: Self-pay | Admitting: Cardiology

## 2022-11-04 ENCOUNTER — Ambulatory Visit: Payer: BC Managed Care – PPO | Attending: Cardiology | Admitting: Cardiology

## 2022-11-04 VITALS — BP 130/80 | HR 73 | Ht 62.0 in | Wt 180.0 lb

## 2022-11-04 DIAGNOSIS — I10 Essential (primary) hypertension: Secondary | ICD-10-CM | POA: Diagnosis not present

## 2022-11-04 DIAGNOSIS — R0789 Other chest pain: Secondary | ICD-10-CM

## 2022-11-04 DIAGNOSIS — E785 Hyperlipidemia, unspecified: Secondary | ICD-10-CM | POA: Diagnosis not present

## 2022-11-04 DIAGNOSIS — R0609 Other forms of dyspnea: Secondary | ICD-10-CM | POA: Diagnosis not present

## 2022-11-04 MED ORDER — METOPROLOL TARTRATE 50 MG PO TABS: ORAL_TABLET | ORAL | 0 refills | Status: DC

## 2022-11-04 NOTE — Patient Instructions (Addendum)
Medication Instructions:  Your physician has recommended you make the following change in your medication:   TAKE: Metoprolol '50mg'$  1 tablet 2 hours prior to CT scan- + Tenormin '25mg'$    *If you need a refill on your cardiac medications before your next appointment, please call your pharmacy*   Lab Work: Your physician recommends that you return for lab work in: 1 week prior to [place] can come Monday through Friday 8:30 am to 12:00 pm and 1:15 to 4:30. You do not need to make an appointment as the order has already been placed. The labs you are going to have done are BMET   Testing/Procedures: Your physician has requested that you have cardiac CT. Cardiac computed tomography (CT) is a painless test that uses an x-ray machine to take clear, detailed pictures of your heart. For further information please visit [email or web address]. Please follow instruction sheet as given.    Your Cardiac CT will be scheduled at:   [facility] located off [facility] at the hospital.  Please arrive 30 minutes prior to your appointment time.  You can use the FREE valet parking offered at entrance to outpatient center (encouraged to control the heart rate for the test)   Please follow these instructions carefully (unless otherwise directed):    On the Night Before the Test: Be sure to Drink plenty of water. Do not consume any caffeinated/decaffeinated beverages or chocolate 12 hours prior to your test. Do not take any antihistamines 12 hours prior to your test.   On the Day of the Test: Drink plenty of water until 1 hour prior to the test. Do not eat any food 4 hours prior to the test. No smoking 4 hours prior to test. You may take your regular medications prior to the test.  Take metoprolol (Lopressor) two hours prior to test. FEMALES- please wear underwire-free bra if available, avoid dresses & tight clothing. Wear plain shirt no beads, sparkles, rhinestones, metal or  heavy embroidery.  After the Test: Drink plenty of water. After receiving IV contrast, you may experience a mild flushed feeling. This is normal. On occasion, you may experience a mild rash up to 24 hours after the test. This is not dangerous. If this occurs, you can take Benadryl 25 mg and increase your fluid intake. If you experience trouble breathing, this can be serious. If it is severe call 911 IMMEDIATELY. If it is mild, please call our office. If you take any of these medications: Glipizide/Metformin, Avandament, Glucavance, please do not take 48 hours after completing test unless otherwise instructed.  We will call to schedule your test 2-4 weeks out understanding that some insurance companies will need an authorization prior to the service being performed.      Follow-Up: At [facility], you and your health needs are our priority.  As part of our continuing mission to provide you with exceptional heart care, we have created designated Provider Care Teams.  These Care Teams include your primary Cardiologist (physician) and Advanced Practice Providers (APPs -  Physician Assistants and Nurse Practitioners) who all work together to provide you with the care you need, when you need it.  We recommend signing up for the patient portal called "MyChart".  Sign up information is provided on this After Visit Summary.  MyChart is used to connect with patients for Virtual Visits (Telemedicine).  Patients are able to view lab/test results, encounter notes, upcoming appointments, etc.  Non-urgent messages can be sent to your  provider as well.   To learn more about what you can do with MyChart, go to [email or web address].    Your next appointment:   3 month(s)  The format for your next appointment:   In Person  Provider:   Jenne Campus, MD    Other Instructions Cardiac CT Angiogram A cardiac CT angiogram is a procedure to look at the heart and the area around the heart. It may  be done to help find the cause of chest pains or other symptoms of heart disease. During this procedure, a substance called contrast dye is injected into the blood vessels in the area to be checked. A large X-ray machine, called a CT scanner, then takes detailed pictures of the heart and the surrounding area. The procedure is also sometimes called a coronary CT angiogram, coronary artery scanning, or CTA. A cardiac CT angiogram allows the health care provider to see how well blood is flowing to and from the heart. The health care provider will be able to see if there are any problems, such as: Blockage or narrowing of the coronary arteries in the heart. Fluid around the heart. Signs of weakness or disease in the muscles, valves, and tissues of the heart. Tell a health care provider about: Any allergies you have. This is especially important if you have had a previous allergic reaction to contrast dye. All medicines you are taking, including vitamins, herbs, eye drops, creams, and over-the-counter medicines. Any blood disorders you have. Any surgeries you have had. Any medical conditions you have. Whether you are pregnant or may be pregnant. Any anxiety disorders, chronic pain, or other conditions you have that may increase your stress or prevent you from lying still. What are the risks? Generally, this is a safe procedure. However, problems may occur, including: Bleeding. Infection. Allergic reactions to medicines or dyes. Damage to other structures or organs. Kidney damage from the contrast dye that is used. Increased risk of cancer from radiation exposure. This risk is low. Talk with your health care provider about: The risks and benefits of testing. How you can receive the lowest dose of radiation. What happens before the procedure? Wear comfortable clothing and remove any jewelry, glasses, dentures, and hearing aids. Follow instructions from your health care provider about eating and  drinking. This may include: For 12 hours before the procedure -- avoid caffeine. This includes tea, coffee, soda, energy drinks, and diet pills. Drink plenty of water or other fluids that do not have caffeine in them. Being well hydrated can prevent complications. For 4-6 hours before the procedure -- stop eating and drinking. The contrast dye can cause nausea, but this is less likely if your stomach is empty. Ask your health care provider about changing or stopping your regular medicines. This is especially important if you are taking diabetes medicines, blood thinners, or medicines to treat problems with erections (erectile dysfunction). What happens during the procedure?  Hair on your chest may need to be removed so that small sticky patches called electrodes can be placed on your chest. These will transmit information that helps to monitor your heart during the procedure. An IV will be inserted into one of your veins. You might be given a medicine to control your heart rate during the procedure. This will help to ensure that good images are obtained. You will be asked to lie on an exam table. This table will slide in and out of the CT machine during the procedure. Contrast dye will be injected into  the IV. You might feel warm, or you may get a metallic taste in your mouth. You will be given a medicine called nitroglycerin. This will relax or dilate the arteries in your heart. The table that you are lying on will move into the CT machine tunnel for the scan. The person running the machine will give you instructions while the scans are being done. You may be asked to: Keep your arms above your head. Hold your breath. Stay very still, even if the table is moving. When the scanning is complete, you will be moved out of the machine. The IV will be removed. The procedure may vary among health care providers and hospitals. What can I expect after the procedure? After your procedure, it is common to  have: A metallic taste in your mouth from the contrast dye. A feeling of warmth. A headache from the nitroglycerin. Follow these instructions at home: Take over-the-counter and prescription medicines only as told by your health care provider. If you are told, drink enough fluid to keep your urine pale yellow. This will help to flush the contrast dye out of your body. Most people can return to their normal activities right after the procedure. Ask your health care provider what activities are safe for you. It is up to you to get the results of your procedure. Ask your health care provider, or the department that is doing the procedure, when your results will be ready. Keep all follow-up visits as told by your health care provider. This is important. Contact a health care provider if: You have any symptoms of allergy to the contrast dye. These include: Shortness of breath. Rash or hives. A racing heartbeat. Summary A cardiac CT angiogram is a procedure to look at the heart and the area around the heart. It may be done to help find the cause of chest pains or other symptoms of heart disease. During this procedure, a large X-ray machine, called a CT scanner, takes detailed pictures of the heart and the surrounding area after a contrast dye has been injected into blood vessels in the area. Ask your health care provider about changing or stopping your regular medicines before the procedure. This is especially important if you are taking diabetes medicines, blood thinners, or medicines to treat erectile dysfunction. If you are told, drink enough fluid to keep your urine pale yellow. This will help to flush the contrast dye out of your body. This information is not intended to replace advice given to you by your health care provider. Make sure you discuss any questions you have with your health care provider. Document Revised: 03/21/2022 Document Reviewed: 07/28/2019 Elsevier Patient Education  [place] physician has requested that you have an echocardiogram. Echocardiography is a painless test that uses sound waves to create images of your heart. It provides your doctor with information about the size and shape of your heart and how well your heart's chambers and valves are working. This procedure takes approximately one hour. There are no restrictions for this procedure. Please do NOT wear cologne, perfume, aftershave, or lotions (deodorant is allowed). Please arrive 15 minutes prior to your appointment time.    Important Information About Sugar

## 2022-11-04 NOTE — Progress Notes (Unsigned)
Cardiology Consultation:    Date:  11/04/2022   ID:  Melissa, Mcguire [birth year], MRN [id number]  PCP:  Ronita Hipps, MD  Cardiologist:  Jenne Campus, MD   Referring MD: Ronita Hipps, MD   Chief Complaint  Patient presents with   Chest Pain    Abnormal ECG per Dr. Helene Kelp    History of Present Illness:    Melissa Mcguire is a 61 y.o. female who is being seen today for the evaluation of abnormal EKG at the request of Ronita Hipps, MD. past medical history significant for dyslipidemia, essential hypertension, difficulty tolerating medications, she was referred to [place] because she started complain of having some chest pain.  First episode when she got few weeks when she was sitting she started having twinges in her chest she graded as 3 in scale of 10, there was no shortness of breath there is no sweating associated with this sensation since that time she got few times this sensation is usually happen at rest.  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  But no coronary artery disease  History reviewed. No pertinent past medical history.  Past Surgical History:  Procedure Laterality Date   APPENDECTOMY     CARPAL TUNNEL RELEASE     FOOT SURGERY Right    hsterectomy     kidney removed      Current Medications: Current Meds  Medication Sig   aspirin 81 MG chewable tablet Chew 81 mg by mouth daily.   atenolol (TENORMIN) 25 MG tablet Take 25 mg by mouth daily.   BIOTIN PO Take 1 tablet by mouth daily.   Calcium Carb-Cholecalciferol (CALCIUM 600+D) 600-20 MG-MCG TABS Take 1 tablet by mouth 2 (two) times daily.   Chlorpheniramine-Phenylephrine (SINUS/ALLERGY PE PO) Take 1 tablet by  mouth 2 (two) times daily.   Cinnamon 500 MG capsule Take 500 mg by mouth daily.   Cranberry-Vitamin C-Probiotic (AZO CRANBERRY PO) Take 1 tablet by mouth daily.   fluticasone (FLONASE) 50 MCG/ACT nasal spray Place 2 sprays into both nostrils daily.   Inulin (FIBER CHOICE PO) Take 1 tablet by mouth in the morning and at bedtime.   Krill Oil 350 MG CAPS Take 1 capsule by mouth daily.   Multiple Vitamins-Minerals (MULTIVITAMIN ADULTS 50+ PO) Take 1 tablet by mouth daily.   Niacinamide-Zn-Cu-Methfo-Se-Cr (NICOTINAMIDE PO) Take 500-1,000 mg by mouth daily.   PRESCRIPTION MEDICATION Inhale 1 application  into the lungs at bedtime. CPAP   promethazine (PHENERGAN) 25 MG tablet Take 25 mg by mouth every 8 (eight) hours as needed for nausea or vomiting.   [DISCONTINUED] ibuprofen (ADVIL,MOTRIN) 800 MG tablet Take 200 mg by mouth every 6 (six) hours as needed for mild pain or moderate pain.   Current Facility-Administered Medications for the 11/04/22 encounter (Office Visit) with Park Liter, MD  Medication   triamcinolone acetonide (KENALOG) 10 MG/ML injection 10 mg   triamcinolone acetonide (KENALOG) 10 MG/ML injection 10 mg     Allergies:   Butorphanol, Codeine, and Losartan   Social History   Socioeconomic History   Marital status: Married    Spouse name: Not on file   Number of children: Not on file  Years of education: Not on file   Highest education level: Not on file  Occupational History   Not on file  Tobacco Use   Smoking status: Never   Smokeless tobacco: Never  Substance and Sexual Activity   Alcohol use: Never   Drug use: Not on file   Sexual activity: Yes  Other Topics Concern   Not on file  Social History Narrative   Not on file   Social Determinants of Health   Financial Resource Strain: Not on file  Food Insecurity: Not on file  Transportation Needs: Not on file  Physical Activity: Not on file  Stress: Not on file  Social Connections: Not on file      Family History: The patient's family history includes Alzheimer's disease in her father and paternal grandfather; CVA in her mother; Cancer in her paternal grandmother; Dementia in her brother and paternal grandmother; Diabetes in her mother; Heart disease in her mother; Heart failure in her mother; Pneumonia in her father; Transient ischemic attack in her mother. ROS:   Please see the history of present illness.    All 14 point review of systems negative except as described per history of present illness.  EKGs/Labs/Other Studies Reviewed:    The following studies were reviewed today:   EKG:  EKG is  ordered today.  The ekg ordered today demonstrates normal sinus rhythm, normal P interval, normal QS complex duration morphology, there is a Q wave inferiorly, nonspecific ST segment changes  Recent Labs: No results found for requested labs within last 365 days.  Recent Lipid Panel No results found for: "CHOL", "TRIG", "HDL", "CHOLHDL", "VLDL", "LDLCALC", "LDLDIRECT"  Physical Exam:    VS:  BP 130/80 (BP Location: Left Arm, Patient Position: Sitting)   Pulse 73   Ht '5\' 2"'$  (1.575 m)   Wt 180 lb (81.6 kg)   SpO2 95%   BMI 32.92 kg/m     Wt Readings from Last 3 Encounters:  11/04/22 180 lb (81.6 kg)  11/13/17 177 lb (80.3 kg)     GEN:  Well nourished, well developed in no acute distress HEENT: Normal NECK: No JVD; No carotid bruits LYMPHATICS: No lymphadenopathy CARDIAC: RRR, no murmurs, no rubs, no gallops RESPIRATORY:  Clear to auscultation without rales, wheezing or rhonchi  ABDOMEN: Soft, non-tender, non-distended MUSCULOSKELETAL:  No edema; No deformity  SKIN: Warm and dry NEUROLOGIC:  Alert and oriented x 3 PSYCHIATRIC:  Normal affect   ASSESSMENT:    1. Atypical chest pain   2. Essential hypertension   3. Dyslipidemia    PLAN:    In order of problems listed above:  Atypical chest pain.  [name] does have multiple risk factors for coronary artery disease she  does have hypertension, dyslipidemia, some family history of likely no coronary artery disease but vascular problems.  I think we need to make sure she does not have any significant coronary artery disease.  In my opinion the best test to assess this will be to do coronary CT angio and I will schedule her to have it. Abnormal EKG showing possibility of inferior wall MI, will get echocardiogram to assess left ventricle ejection fraction. Dyslipidemia I did review her K PN which show me LDL 137 and HDL 42.  We will schedule her to have coronary CT angio and based on that we decide how aggressive we need to be with her cholesterol management.  She already told me that she will be very unwilling to take cholesterol medication. Essential  hypertension first visit in the office blood pressure acceptable on the higher range we will continue monitoring   Medication Adjustments/Labs and Tests Ordered: Current medicines are reviewed at length with the patient today.  Concerns regarding medicines are outlined above.  No orders of the defined types were placed in this encounter.  No orders of the defined types were placed in this encounter.   Signed, Park Liter, MD, [facility]. 11/04/2022 4:14 PM    [place]

## 2022-11-12 ENCOUNTER — Telehealth: Payer: Self-pay | Admitting: Cardiology

## 2022-11-12 NOTE — Telephone Encounter (Signed)
Called patient and she is very nervous about having the CTA done because she only has one kidney and she doesn't want to damage her only kidney. I explained that we check her kidney function a week before her CTA to make sure her kidney will be functioning properly so she can have the CTA. She was still very concerned about having the test and wanted to know if there were other options/ test that she could have done to address her chest pain, which was the reason for her last office visit.

## 2022-11-12 NOTE — Telephone Encounter (Signed)
Pt would like a call back to discuss CT test that Dr. Agustin Cree is wanting patient to have. She states she is concerned about the possible kidney damage from this test. Requesting call back to discuss other options.

## 2022-11-13 NOTE — Telephone Encounter (Signed)
Called patient and informed her of Dr. Wendy Poet recommendation below:  "Alternative to this will be to do stress test in form of Lexiscan if she prefers that is fine"   Patient asked what kind of medicine did they inject into the IV and I spoke to one of the nurses who perform the stress test and they stated that it was called Sestamibi, which is a radioactive tracer. The nurse also stated that the [facility] does not affect the kidney's. I relayed this information to the patient and attempted to reassure that we do these types of test everyday. Patient stated that she wanted to do some research on this test and the radioactive tracer and she would let us know if she wanted to move forward and have the test. Patient had no further questions at this time.

## 2022-11-20 ENCOUNTER — Ambulatory Visit: Payer: BC Managed Care – PPO | Attending: Cardiology

## 2022-11-20 DIAGNOSIS — I10 Essential (primary) hypertension: Secondary | ICD-10-CM

## 2022-11-20 DIAGNOSIS — R0789 Other chest pain: Secondary | ICD-10-CM

## 2022-11-20 DIAGNOSIS — R0609 Other forms of dyspnea: Secondary | ICD-10-CM | POA: Diagnosis not present

## 2022-11-20 LAB — ECHOCARDIOGRAM COMPLETE
Area-P 1/2: 3.87 cm2
S' Lateral: 2.5 cm

## 2022-11-26 ENCOUNTER — Telehealth: Payer: Self-pay

## 2022-11-26 NOTE — Telephone Encounter (Signed)
Patient notified via mychart

## 2022-11-26 NOTE — Telephone Encounter (Signed)
-----   Message from Park Liter, MD sent at 11/26/2022  8:40 AM EST ----- Echocardiogram showed preserved left ventricle ejection fraction, overall looks good

## 2022-12-05 DIAGNOSIS — Z6834 Body mass index (BMI) 34.0-34.9, adult: Secondary | ICD-10-CM | POA: Diagnosis not present

## 2022-12-05 DIAGNOSIS — I10 Essential (primary) hypertension: Secondary | ICD-10-CM | POA: Diagnosis not present

## 2022-12-06 ENCOUNTER — Telehealth: Payer: Self-pay

## 2022-12-06 DIAGNOSIS — R0789 Other chest pain: Secondary | ICD-10-CM

## 2022-12-06 NOTE — Telephone Encounter (Signed)
Pt called to schedule Lexiscan as per Dr. Wendy Poet order. Instructions sent via My Chart

## 2022-12-18 ENCOUNTER — Telehealth (HOSPITAL_COMMUNITY): Payer: Self-pay | Admitting: *Deleted

## 2022-12-18 NOTE — Telephone Encounter (Signed)
Pt is returning call.  

## 2022-12-18 NOTE — Telephone Encounter (Signed)
Called pt to give instructions for MPI study. Pt was getting ready for meeting an stated she would call back for instructions.

## 2022-12-25 ENCOUNTER — Ambulatory Visit: Payer: BC Managed Care – PPO | Attending: Cardiology

## 2022-12-25 DIAGNOSIS — R0789 Other chest pain: Secondary | ICD-10-CM | POA: Diagnosis not present

## 2022-12-25 LAB — MYOCARDIAL PERFUSION IMAGING
LV dias vol: 49 mL (ref 46–106)
LV sys vol: 10 mL
Nuc Stress EF: 81 %
Peak HR: 127 {beats}/min
Rest HR: 83 {beats}/min
Rest Nuclear Isotope Dose: 10.8 mCi
SDS: 0
SRS: 0
SSS: 0
Stress Nuclear Isotope Dose: 29.8 mCi
TID: 0.81

## 2022-12-25 MED ORDER — TECHNETIUM TC 99M TETROFOSMIN IV KIT
10.8000 | PACK | Freq: Once | INTRAVENOUS | Status: AC | PRN
  Administered 2022-12-25: 10.8 via INTRAVENOUS

## 2022-12-25 MED ORDER — TECHNETIUM TC 99M TETROFOSMIN IV KIT
29.8000 | PACK | Freq: Once | INTRAVENOUS | Status: AC | PRN
  Administered 2022-12-25: 29.8 via INTRAVENOUS

## 2022-12-25 MED ORDER — REGADENOSON 0.4 MG/5ML IV SOLN
0.4000 mg | Freq: Once | INTRAVENOUS | Status: AC
  Administered 2022-12-25: 0.4 mg via INTRAVENOUS

## 2022-12-26 ENCOUNTER — Telehealth: Payer: Self-pay

## 2022-12-26 NOTE — Telephone Encounter (Signed)
Patient notified of results.

## 2022-12-26 NOTE — Telephone Encounter (Signed)
-----   Message from Park Liter, MD sent at 12/26/2022 12:26 PM EST ----- Stress test is normal

## 2023-01-08 DIAGNOSIS — N750 Cyst of Bartholin's gland: Secondary | ICD-10-CM | POA: Diagnosis not present

## 2023-01-20 DIAGNOSIS — Z6834 Body mass index (BMI) 34.0-34.9, adult: Secondary | ICD-10-CM | POA: Diagnosis not present

## 2023-01-20 DIAGNOSIS — Z01419 Encounter for gynecological examination (general) (routine) without abnormal findings: Secondary | ICD-10-CM | POA: Diagnosis not present

## 2023-01-23 ENCOUNTER — Telehealth: Payer: Self-pay

## 2023-01-23 NOTE — Telephone Encounter (Signed)
Reviewing CT schedule- Pt decided not to schedule. She has follow up with Dr. Agustin Cree.

## 2023-02-06 ENCOUNTER — Ambulatory Visit: Payer: BC Managed Care – PPO | Admitting: Cardiology

## 2023-03-20 ENCOUNTER — Ambulatory Visit
Admission: RE | Admit: 2023-03-20 | Discharge: 2023-03-20 | Disposition: A | Discharge status: Home / Self Care | Payer: BC Managed Care – PPO | Source: Ambulatory Visit | Attending: Obstetrics & Gynecology | Admitting: Obstetrics & Gynecology

## 2023-03-20 DIAGNOSIS — N6310 Unspecified lump in the right breast, unspecified quadrant: Secondary | ICD-10-CM

## 2023-03-20 DIAGNOSIS — R928 Other abnormal and inconclusive findings on diagnostic imaging of breast: Secondary | ICD-10-CM | POA: Diagnosis not present

## 2023-03-20 DIAGNOSIS — N6311 Unspecified lump in the right breast, upper outer quadrant: Secondary | ICD-10-CM | POA: Diagnosis not present

## 2023-04-01 ENCOUNTER — Encounter: Payer: Self-pay | Admitting: Cardiology

## 2023-04-01 ENCOUNTER — Ambulatory Visit: Payer: BC Managed Care – PPO | Attending: Cardiology | Admitting: Cardiology

## 2023-04-01 VITALS — BP 132/84 | HR 84 | Ht 62.0 in | Wt 191.0 lb

## 2023-04-01 DIAGNOSIS — G4733 Obstructive sleep apnea (adult) (pediatric): Secondary | ICD-10-CM

## 2023-04-01 DIAGNOSIS — E785 Hyperlipidemia, unspecified: Secondary | ICD-10-CM

## 2023-04-01 DIAGNOSIS — R0789 Other chest pain: Secondary | ICD-10-CM | POA: Diagnosis not present

## 2023-04-01 DIAGNOSIS — I10 Essential (primary) hypertension: Secondary | ICD-10-CM

## 2023-04-01 NOTE — Patient Instructions (Signed)
Medication Instructions:  Your physician recommends that you continue on your current medications as directed. Please refer to the Current Medication list given to you today.  *If you need a refill on your cardiac medications before your next appointment, please call your pharmacy*   Lab Work: None Ordered If you have labs (blood work) drawn today and your tests are completely normal, you will receive your results only by: MyChart Message (if you have MyChart) OR A paper copy in the mail If you have any lab test that is abnormal or we need to change your treatment, we will call you to review the results.   Testing/Procedures: We will order CT coronary calcium score. It will cost $99.00 and is not covered by insurance.  Please call to schedule.    [facility] [address] [place], [place] [address] [phone]     Follow-Up: At [facility], you and your health needs are our priority.  As part of our continuing mission to provide you with exceptional heart care, we have created designated Provider Care Teams.  These Care Teams include your primary Cardiologist (physician) and Advanced Practice Providers (APPs -  Physician Assistants and Nurse Practitioners) who all work together to provide you with the care you need, when you need it.  We recommend signing up for the patient portal called "MyChart".  Sign up information is provided on this After Visit Summary.  MyChart is used to connect with patients for Virtual Visits (Telemedicine).  Patients are able to view lab/test results, encounter notes, upcoming appointments, etc.  Non-urgent messages can be sent to your provider as well.   To learn more about what you can do with MyChart, go to [email or web address].    Your next appointment:   12 month(s)  The format for your next appointment:   In Person  Provider:   Gypsy Balsam, MD    Other Instructions NA

## 2023-04-01 NOTE — Progress Notes (Signed)
Cardiology Office Note:    Date:  04/01/2023   ID:  Shianne, Zeiser 1961/03/10, MRN [id number]  PCP:  Marylen Ponto, MD  Cardiologist:  Gypsy Balsam, MD    Referring MD: Marylen Ponto, MD   Chief Complaint  Patient presents with   Follow-up  Doing fine  History of Present Illness:    Melissa Mcguire is a 62 y.o. female she was referred to [place] initially because of atypical chest pain, her past medical history significant for dyslipidemia, essential hypertension difficulty tolerating medications.  She is supposed to have a coronary CT angio however it was not done she was afraid about her kidney function) she does have only 1 kidney.  She did have a stress test which was negative she comes today to talk about it.  Overall doing well.  Denies have any chest pain tightness squeezing pressure burning chest.  Still do line dancing she also take care of her grandson 3 times a week.  History reviewed. No pertinent past medical history.  Past Surgical History:  Procedure Laterality Date   APPENDECTOMY     CARPAL TUNNEL RELEASE     FOOT SURGERY Right    hsterectomy     kidney removed      Current Medications: Current Meds  Medication Sig   aspirin 81 MG chewable tablet Chew 81 mg by mouth daily.   atenolol (TENORMIN) 25 MG tablet Take 25 mg by mouth daily.   BIOTIN PO Take 1 tablet by mouth daily.   Calcium Carb-Cholecalciferol (CALCIUM 600+D) 600-20 MG-MCG TABS Take 1 tablet by mouth 2 (two) times daily.   Chlorpheniramine-Phenylephrine (SINUS/ALLERGY PE PO) Take 1 tablet by mouth 2 (two) times daily.   Cinnamon 500 MG capsule Take 500 mg by mouth daily.   Cranberry-Vitamin C-Probiotic (AZO CRANBERRY PO) Take 1 tablet by mouth daily.   fluticasone (FLONASE) 50 MCG/ACT nasal spray Place 1 spray into both nostrils as needed for allergies.   Inulin (FIBER CHOICE PO) Take 1 tablet by mouth in the morning and at bedtime.   Krill Oil 350 MG CAPS Take 1 capsule by mouth daily.    Multiple Vitamins-Minerals (MULTIVITAMIN ADULTS 50+ PO) Take 1 tablet by mouth daily.   Niacinamide-Zn-Cu-Methfo-Se-Cr (NICOTINAMIDE PO) Take 500-1,000 mg by mouth daily.   PRESCRIPTION MEDICATION Inhale 1 application  into the lungs at bedtime. CPAP   promethazine (PHENERGAN) 25 MG tablet Take 25 mg by mouth every 8 (eight) hours as needed for nausea or vomiting.   [DISCONTINUED] metoprolol tartrate (LOPRESSOR) 50 MG tablet Take one tablet 2 hours before cardiac CT for heart greater than 55 (Patient taking differently: 50 mg. Take one tablet 2 hours before cardiac CT for heart greater than 55)   Current Facility-Administered Medications for the 04/01/23 encounter (Office Visit) with Georgeanna Lea, MD  Medication   triamcinolone acetonide (KENALOG) 10 MG/ML injection 10 mg   triamcinolone acetonide (KENALOG) 10 MG/ML injection 10 mg     Allergies:   Butorphanol, Codeine, and Losartan   Social History   Socioeconomic History   Marital status: Married    Spouse name: Not on file   Number of children: Not on file   Years of education: Not on file   Highest education level: Not on file  Occupational History   Not on file  Tobacco Use   Smoking status: Never   Smokeless tobacco: Never  Substance and Sexual Activity   Alcohol use: Never   Drug use: Not  on file   Sexual activity: Yes  Other Topics Concern   Not on file  Social History Narrative   Not on file   Social Determinants of Health   Financial Resource Strain: Not on file  Food Insecurity: Not on file  Transportation Needs: Not on file  Physical Activity: Not on file  Stress: Not on file  Social Connections: Not on file     Family History: The patient's family history includes Alzheimer's disease in her father and paternal grandfather; CVA in her mother; Cancer in her paternal grandmother; Dementia in her brother and paternal grandmother; Diabetes in her mother; Heart disease in her mother; Heart failure in her  mother; Pneumonia in her father; Transient ischemic attack in her mother. ROS:   Please see the history of present illness.    All 14 point review of systems negative except as described per history of present illness  EKGs/Labs/Other Studies Reviewed:      Recent Labs: No results found for requested labs within last 365 days.  Recent Lipid Panel No results found for: "CHOL", "TRIG", "HDL", "CHOLHDL", "VLDL", "LDLCALC", "LDLDIRECT"  Physical Exam:    VS:  BP 132/84 (BP Location: Left Arm, Patient Position: Sitting)   Pulse 84   Ht  (1.575 m)   Wt 191 lb (86.6 kg)   SpO2 98%   BMI 34.93 kg/m     Wt Readings from Last 3 Encounters:  04/01/23 191 lb (86.6 kg)  12/25/22 180 lb (81.6 kg)  11/04/22 180 lb (81.6 kg)     GEN:  Well nourished, well developed in no acute distress HEENT: Normal NECK: No JVD; No carotid bruits LYMPHATICS: No lymphadenopathy CARDIAC: RRR, no murmurs, no rubs, no gallops RESPIRATORY:  Clear to auscultation without rales, wheezing or rhonchi  ABDOMEN: Soft, non-tender, non-distended MUSCULOSKELETAL:  No edema; No deformity  SKIN: Warm and dry LOWER EXTREMITIES: no swelling NEUROLOGIC:  Alert and oriented x 3 PSYCHIATRIC:  Normal affect   ASSESSMENT:    1. Atypical chest pain   2. Dyslipidemia   3. Essential hypertension   4. Obstructive sleep apnea syndrome    PLAN:    In order of problems listed above:  Atypical chest pain.  Denies having any stress test negative continue risk factors modifications Dyslipidemia she is reluctant to take any medication for it we had a long discussion about what to do with the situation and I think there is a value of doing a calcium score which she agreed to have done. I did review K PN which show me her LDL 137 HDL 42.  Plan as described above. Obstructive sleep apnea to be followed by antimedicine team. Essential hypertension blood pressure well-controlled continue present management   Medication  Adjustments/Labs and Tests Ordered: Current medicines are reviewed at length with the patient today.  Concerns regarding medicines are outlined above.  No orders of the defined types were placed in this encounter.  Medication changes: No orders of the defined types were placed in this encounter.   Signed, Georgeanna Lea, MD, [facility] 04/01/2023 2:01 PM    [facility]

## 2023-04-01 NOTE — Addendum Note (Signed)
Addended by: Baldo Ash D on: 04/01/2023 02:15 PM   Modules accepted: Orders

## 2023-04-07 ENCOUNTER — Ambulatory Visit (HOSPITAL_BASED_OUTPATIENT_CLINIC_OR_DEPARTMENT_OTHER)
Admission: RE | Admit: 2023-04-07 | Discharge: 2023-04-07 | Disposition: A | Discharge status: Home / Self Care | Payer: BC Managed Care – PPO | Source: Ambulatory Visit | Attending: Cardiology | Admitting: Cardiology

## 2023-04-07 DIAGNOSIS — R0789 Other chest pain: Secondary | ICD-10-CM

## 2023-04-07 DIAGNOSIS — E785 Hyperlipidemia, unspecified: Secondary | ICD-10-CM | POA: Insufficient documentation

## 2023-04-07 IMAGING — US US BREAST*R* LIMITED INC AXILLA
1 series · 12 of 12 positions shown · non-contrast
Comparison: Previous exam(s).

CLINICAL DATA: Patient recalled from screening for right breast
asymmetry.

EXAM:
DIGITAL DIAGNOSTIC UNILATERAL RIGHT MAMMOGRAM WITH TOMOSYNTHESIS AND
CAD; ULTRASOUND RIGHT BREAST LIMITED
TECHNIQUE: Right digital diagnostic mammography and breast tomosynthesis was
performed. The images were evaluated with computer-aided detection.;
Targeted ultrasound examination of the right breast was performed

[Series 1: us breast*right* limited inc axilla · 0.07mm/px · 12 of 12 slices shown]
[im 1/12]
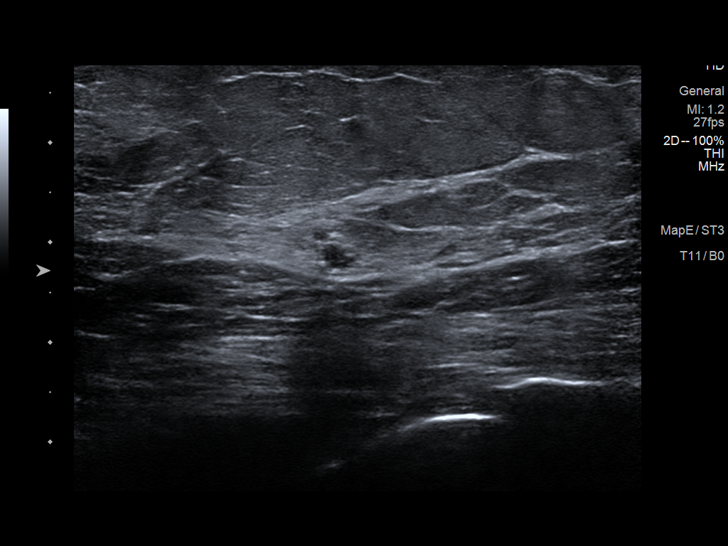
[im 2/12]
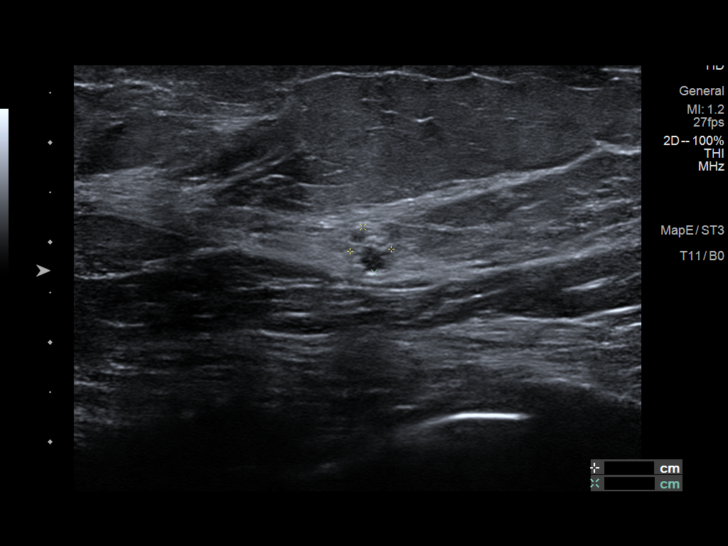
[im 3/12]
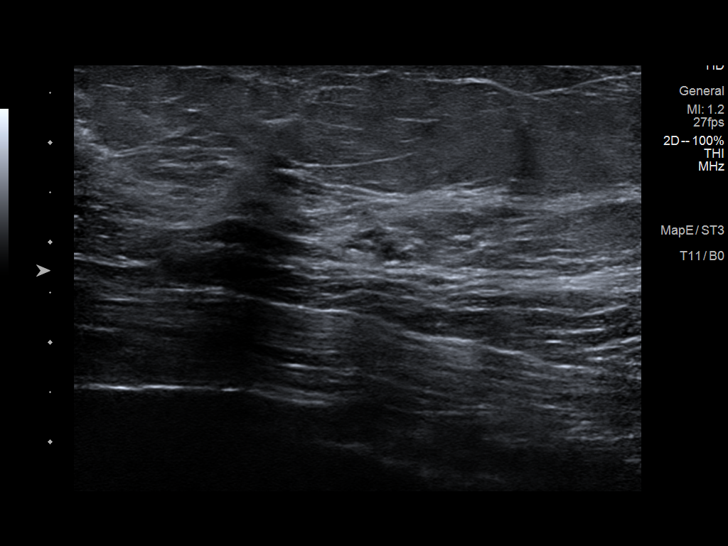
[im 4/12]
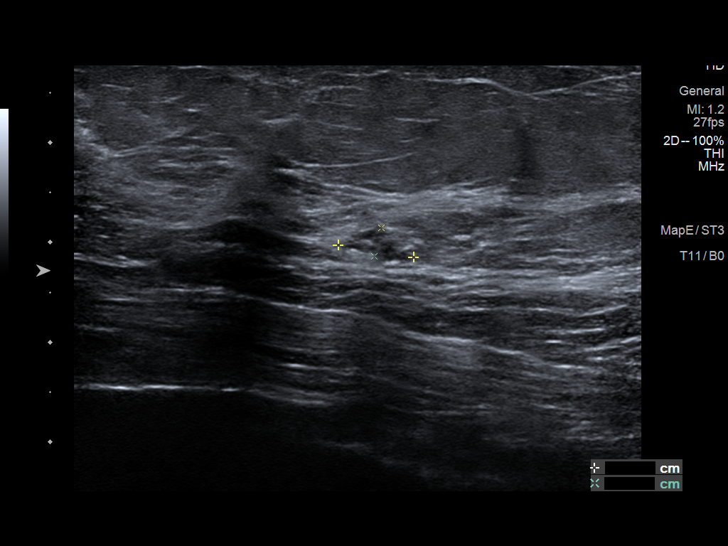
[im 5/12]
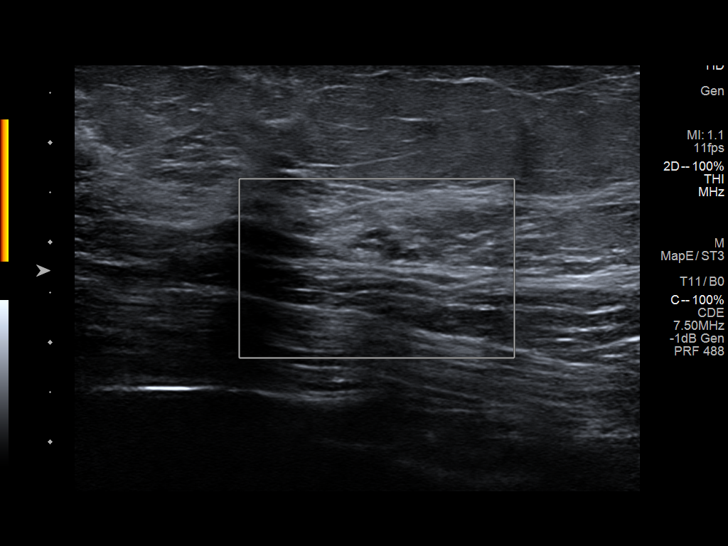
[im 6/12]
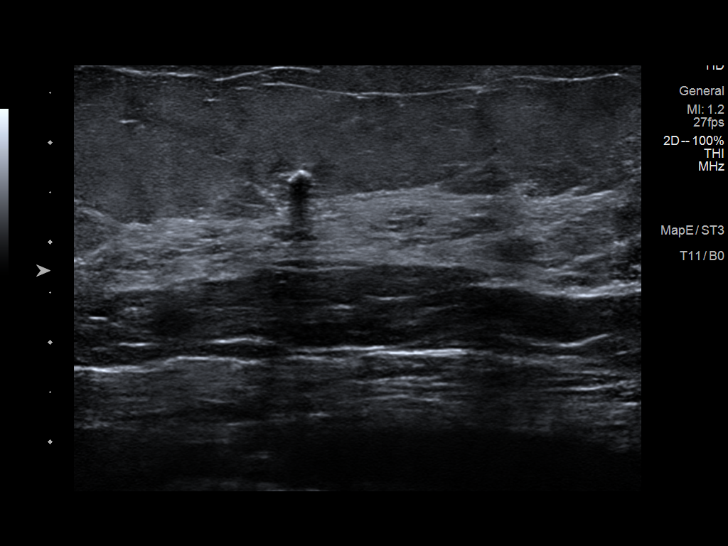
[im 7/12]
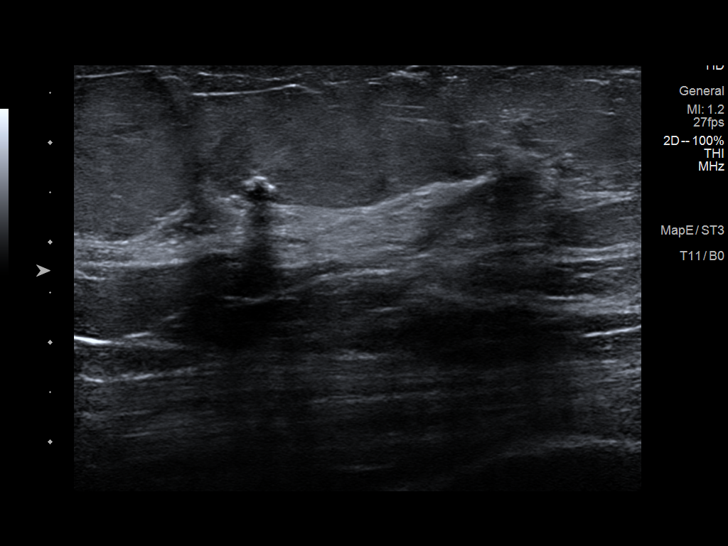
[im 8/12]
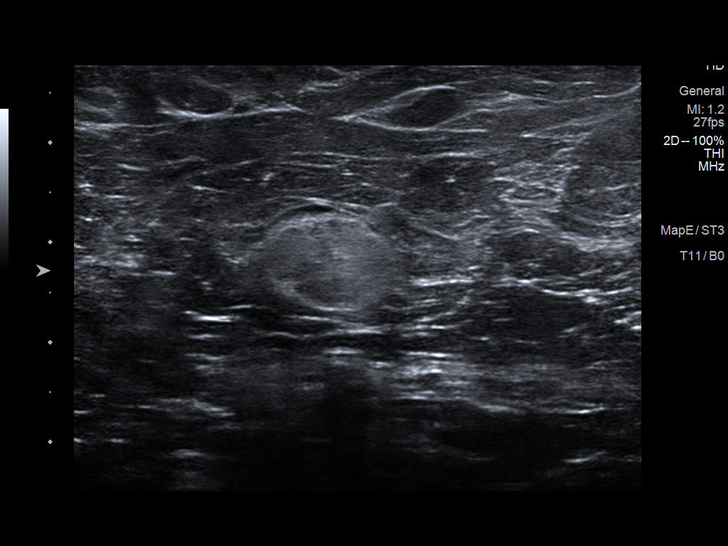
[im 9/12]
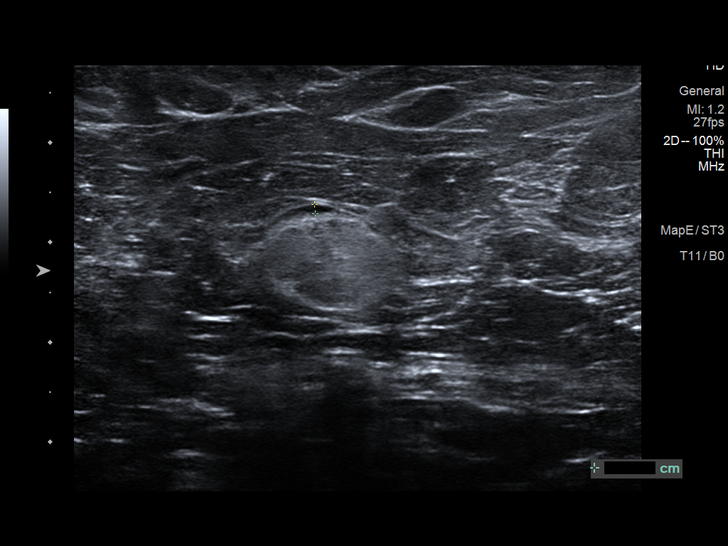
[im 10/12]
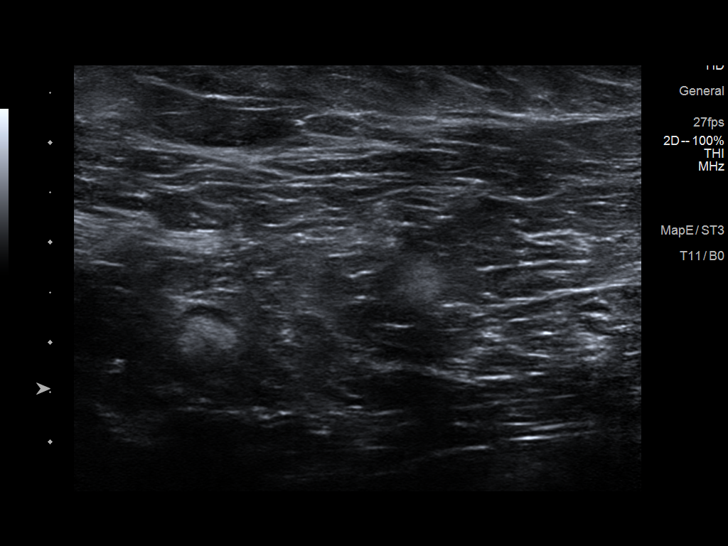
[im 11/12]
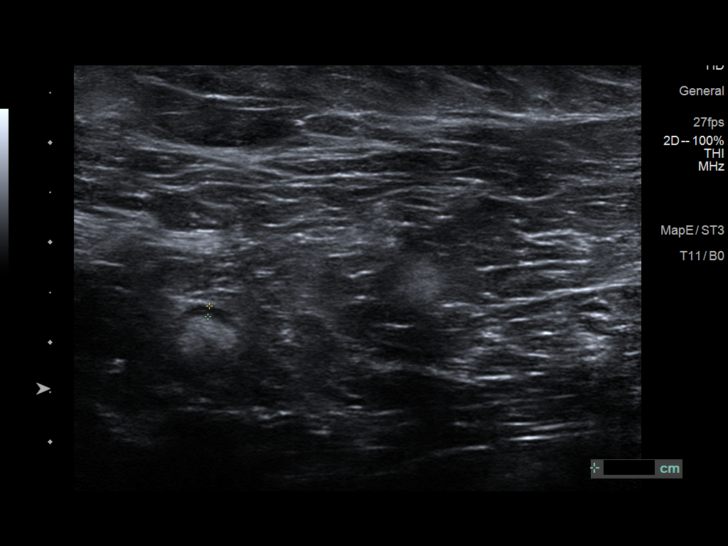
[im 12/12]
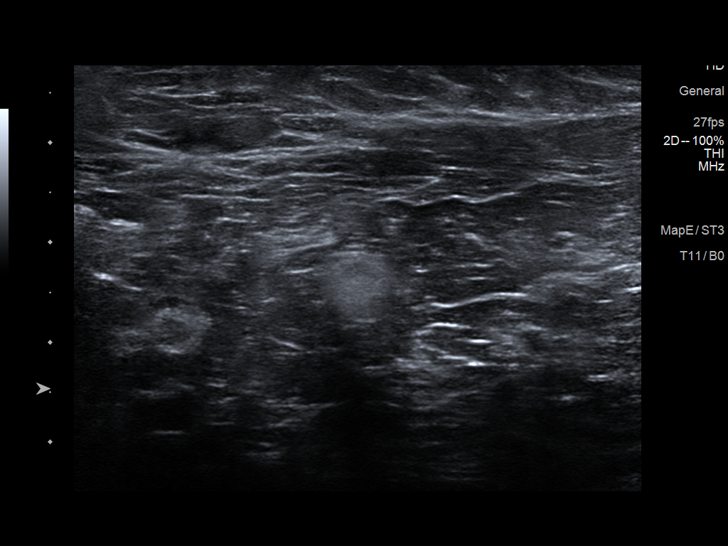

[12 of 12 positions shown; findings below may reference images not displayed]

ACR Breast Density Category c: The breast tissue is heterogeneously
dense, which may obscure small masses.
FINDINGS: There is a persistent focal asymmetry demonstrated best on the spot
compression cc views within the outer right breast middle depth.

Targeted ultrasound is performed, showing a 4 x 5 x 8 mm probable
cluster of cysts right breast 10 o'clock position 6 cm from the
nipple.
IMPRESSION: Probably benign right breast mass favored to represent a cluster of
cysts.

RECOMMENDATION:
Right breast diagnostic mammogram and ultrasound in 6 months to
reassess the probably benign right breast mass.

I have discussed the findings and recommendations with the patient.
If applicable, a reminder letter will be sent to the patient
regarding the next appointment.

BI-RADS CATEGORY  3: Probably benign.

## 2023-04-14 ENCOUNTER — Telehealth: Payer: Self-pay

## 2023-04-14 NOTE — Telephone Encounter (Signed)
Spoke with pt about CT score. Advised that we need to get a Lipid panel to decide next steps. She stated that she had a Lipid done at PCP in Feb. Will call to get results.

## 2023-04-16 ENCOUNTER — Telehealth: Payer: Self-pay

## 2023-04-16 DIAGNOSIS — E785 Hyperlipidemia, unspecified: Secondary | ICD-10-CM

## 2023-04-16 NOTE — Telephone Encounter (Signed)
Reviewed pts Cholesterol from 12-23. Dr. Bing Matter recommended that she come in for a more recent Lipid panel. LVM for pt to come in for BW. Test added to computer.

## 2023-04-22 ENCOUNTER — Telehealth: Payer: Self-pay | Admitting: Cardiology

## 2023-04-22 NOTE — Telephone Encounter (Signed)
Patient is returning RN's call regarding Dr. Bing Matter wanting a lipid panel. She states she is scheduled to see her PCP 06/10 and will be having fasting labs performed that she will have sent over.   Per pt a callback is not necessary unless 06/10 is too far out for Dr. Bing Matter.

## 2023-04-30 NOTE — Telephone Encounter (Signed)
Message addressed.

## 2023-05-10 DIAGNOSIS — R300 Dysuria: Secondary | ICD-10-CM | POA: Diagnosis not present

## 2023-05-10 DIAGNOSIS — N3001 Acute cystitis with hematuria: Secondary | ICD-10-CM | POA: Diagnosis not present

## 2023-05-20 ENCOUNTER — Encounter: Payer: Self-pay | Admitting: Cardiology

## 2023-05-26 DIAGNOSIS — Z1331 Encounter for screening for depression: Secondary | ICD-10-CM | POA: Diagnosis not present

## 2023-05-26 DIAGNOSIS — Z23 Encounter for immunization: Secondary | ICD-10-CM | POA: Diagnosis not present

## 2023-05-26 DIAGNOSIS — Z1339 Encounter for screening examination for other mental health and behavioral disorders: Secondary | ICD-10-CM | POA: Diagnosis not present

## 2023-05-26 DIAGNOSIS — Z6834 Body mass index (BMI) 34.0-34.9, adult: Secondary | ICD-10-CM | POA: Diagnosis not present

## 2023-05-26 DIAGNOSIS — M8580 Other specified disorders of bone density and structure, unspecified site: Secondary | ICD-10-CM | POA: Diagnosis not present

## 2023-05-26 DIAGNOSIS — Z0000 Encounter for general adult medical examination without abnormal findings: Secondary | ICD-10-CM | POA: Diagnosis not present

## 2023-05-26 DIAGNOSIS — E782 Mixed hyperlipidemia: Secondary | ICD-10-CM | POA: Diagnosis not present

## 2023-05-26 LAB — LAB REPORT - SCANNED: EGFR: 60

## 2023-06-03 DIAGNOSIS — L821 Other seborrheic keratosis: Secondary | ICD-10-CM | POA: Diagnosis not present

## 2023-06-03 DIAGNOSIS — D2372 Other benign neoplasm of skin of left lower limb, including hip: Secondary | ICD-10-CM | POA: Diagnosis not present

## 2023-06-03 DIAGNOSIS — L578 Other skin changes due to chronic exposure to nonionizing radiation: Secondary | ICD-10-CM | POA: Diagnosis not present

## 2023-06-03 DIAGNOSIS — L820 Inflamed seborrheic keratosis: Secondary | ICD-10-CM | POA: Diagnosis not present

## 2023-06-03 DIAGNOSIS — L728 Other follicular cysts of the skin and subcutaneous tissue: Secondary | ICD-10-CM | POA: Diagnosis not present

## 2023-08-15 DIAGNOSIS — L309 Dermatitis, unspecified: Secondary | ICD-10-CM | POA: Diagnosis not present

## 2023-08-15 DIAGNOSIS — Z6834 Body mass index (BMI) 34.0-34.9, adult: Secondary | ICD-10-CM | POA: Diagnosis not present

## 2023-10-13 DIAGNOSIS — M7631 Iliotibial band syndrome, right leg: Secondary | ICD-10-CM | POA: Diagnosis not present

## 2023-10-13 DIAGNOSIS — M25561 Pain in right knee: Secondary | ICD-10-CM | POA: Diagnosis not present

## 2023-10-13 DIAGNOSIS — M222X1 Patellofemoral disorders, right knee: Secondary | ICD-10-CM | POA: Diagnosis not present

## 2023-10-20 DIAGNOSIS — G4733 Obstructive sleep apnea (adult) (pediatric): Secondary | ICD-10-CM | POA: Diagnosis not present

## 2023-11-12 DIAGNOSIS — M222X1 Patellofemoral disorders, right knee: Secondary | ICD-10-CM | POA: Diagnosis not present

## 2023-11-12 DIAGNOSIS — M7631 Iliotibial band syndrome, right leg: Secondary | ICD-10-CM | POA: Diagnosis not present

## 2023-11-12 DIAGNOSIS — M25561 Pain in right knee: Secondary | ICD-10-CM | POA: Diagnosis not present

## 2024-01-19 DIAGNOSIS — L820 Inflamed seborrheic keratosis: Secondary | ICD-10-CM | POA: Diagnosis not present

## 2024-01-19 DIAGNOSIS — L821 Other seborrheic keratosis: Secondary | ICD-10-CM | POA: Diagnosis not present

## 2024-02-05 DIAGNOSIS — H524 Presbyopia: Secondary | ICD-10-CM | POA: Diagnosis not present

## 2024-03-09 DIAGNOSIS — Z6835 Body mass index (BMI) 35.0-35.9, adult: Secondary | ICD-10-CM | POA: Diagnosis not present

## 2024-03-09 DIAGNOSIS — I10 Essential (primary) hypertension: Secondary | ICD-10-CM | POA: Diagnosis not present

## 2024-03-09 DIAGNOSIS — E785 Hyperlipidemia, unspecified: Secondary | ICD-10-CM | POA: Diagnosis not present

## 2024-03-09 DIAGNOSIS — Z1159 Encounter for screening for other viral diseases: Secondary | ICD-10-CM | POA: Diagnosis not present

## 2024-03-29 DIAGNOSIS — Z1231 Encounter for screening mammogram for malignant neoplasm of breast: Secondary | ICD-10-CM | POA: Diagnosis not present

## 2024-03-29 DIAGNOSIS — Z01419 Encounter for gynecological examination (general) (routine) without abnormal findings: Secondary | ICD-10-CM | POA: Diagnosis not present

## 2024-04-06 DIAGNOSIS — K080 Exfoliation of teeth due to systemic causes: Secondary | ICD-10-CM | POA: Diagnosis not present

## 2024-04-13 ENCOUNTER — Ambulatory Visit: Attending: Cardiology | Admitting: Cardiology

## 2024-04-13 ENCOUNTER — Encounter: Payer: Self-pay | Admitting: Cardiology

## 2024-04-13 VITALS — BP 136/78 | HR 72 | Ht 62.0 in | Wt 195.0 lb

## 2024-04-13 DIAGNOSIS — G4733 Obstructive sleep apnea (adult) (pediatric): Secondary | ICD-10-CM

## 2024-04-13 DIAGNOSIS — E785 Hyperlipidemia, unspecified: Secondary | ICD-10-CM | POA: Diagnosis not present

## 2024-04-13 DIAGNOSIS — I10 Essential (primary) hypertension: Secondary | ICD-10-CM | POA: Diagnosis not present

## 2024-04-13 DIAGNOSIS — R931 Abnormal findings on diagnostic imaging of heart and coronary circulation: Secondary | ICD-10-CM | POA: Insufficient documentation

## 2024-04-13 DIAGNOSIS — R0789 Other chest pain: Secondary | ICD-10-CM | POA: Diagnosis not present

## 2024-04-13 MED ORDER — EZETIMIBE 10 MG PO TABS: 10.0000 mg | ORAL_TABLET | Freq: Every day | ORAL | 3 refills | Status: AC

## 2024-04-13 NOTE — Addendum Note (Signed)
 Addended by: Shawnee Dellen D on: 04/13/2024 02:05 PM   Modules accepted: Orders

## 2024-04-13 NOTE — Progress Notes (Signed)
 Cardiology Office Note:    Date:  04/13/2024   ID:  Melissa Mcguire, Melissa Mcguire 11/17/1961, MRN [id number]  PCP:  Gaither Juba, MD  Cardiologist:  Ralene Burger, MD    Referring MD: Gaither Juba, MD   Chief Complaint  Patient presents with   Follow-up    History of Present Illness:    Melissa Mcguire is a 63 y.o. female past medical history significant for essential hypertension, dyslipidemia, refused to take statin, elevated calcium score 95 last year.  She is coming today 2 months for follow-up.  Overall cardiac wise doing well.  Denies have any chest pain tightness squeezing pressure burning chest she admits that she is not as active as she should be about she is planning to be more active.  No cardiac complaints  History reviewed. No pertinent past medical history.  Past Surgical History:  Procedure Laterality Date   APPENDECTOMY     CARPAL TUNNEL RELEASE     FOOT SURGERY Right    hsterectomy     kidney removed      Current Medications: Current Meds  Medication Sig   aspirin 81 MG chewable tablet Chew 81 mg by mouth daily.   atenolol (TENORMIN) 25 MG tablet Take 25 mg by mouth daily.   BIOTIN PO Take 1 tablet by mouth daily.   Calcium Carb-Cholecalciferol (CALCIUM 600+D) 600-20 MG-MCG TABS Take 1 tablet by mouth 2 (two) times daily.   Chlorpheniramine-Phenylephrine (SINUS/ALLERGY PE PO) Take 1 tablet by mouth 2 (two) times daily.   Cinnamon 500 MG capsule Take 500 mg by mouth daily.   Cranberry-Vitamin C-Probiotic (AZO CRANBERRY PO) Take 1 tablet by mouth daily.   fluticasone (FLONASE) 50 MCG/ACT nasal spray Place 1 spray into both nostrils as needed for allergies.   Inulin (FIBER CHOICE PO) Take 1 tablet by mouth in the morning and at bedtime.   Krill Oil 350 MG CAPS Take 1 capsule by mouth daily.   KRILL OIL PO Take 1 tablet by mouth daily.   Multiple Vitamins-Minerals (MULTIVITAMIN ADULTS 50+ PO) Take 1 tablet by mouth daily.   Niacinamide-Zn-Cu-Methfo-Se-Cr  (NICOTINAMIDE PO) Take 500-1,000 mg by mouth daily.   PRESCRIPTION MEDICATION Inhale 1 application  into the lungs at bedtime. CPAP   promethazine (PHENERGAN) 25 MG tablet Take 25 mg by mouth every 8 (eight) hours as needed for nausea or vomiting.   [DISCONTINUED] Calcium Carb-Cholecalciferol (CALCIUM 500 +D PO) Take 1 tablet by mouth daily.   [DISCONTINUED] Multiple Vitamin (MULTIVITAMIN ADULT PO) Take 1 tablet by mouth daily.   Current Facility-Administered Medications for the 04/13/24 encounter (Office Visit) with Mohd Clemons J, MD  Medication   triamcinolone  acetonide (KENALOG ) 10 MG/ML injection 10 mg   triamcinolone  acetonide (KENALOG ) 10 MG/ML injection 10 mg     Allergies:   Butorphanol, Codeine, and Losartan   Social History   Socioeconomic History   Marital status: Married    Spouse name: Not on file   Number of children: Not on file   Years of education: Not on file   Highest education level: Not on file  Occupational History   Not on file  Tobacco Use   Smoking status: Never   Smokeless tobacco: Never  Substance and Sexual Activity   Alcohol use: Never   Drug use: Not on file   Sexual activity: Yes  Other Topics Concern   Not on file  Social History Narrative   Not on file   Social Drivers of Health  Financial Resource Strain: Not on file  Food Insecurity: Not on file  Transportation Needs: Not on file  Physical Activity: Not on file  Stress: Not on file  Social Connections: Not on file     Family History: The patient's family history includes Alzheimer's disease in her father and paternal grandfather; CVA in her mother; Cancer in her paternal grandmother; Dementia in her brother and paternal grandmother; Diabetes in her mother; Heart disease in her mother; Heart failure in her mother; Pneumonia in her father; Transient ischemic attack in her mother. ROS:   Please see the history of present illness.    All 14 point review of systems negative except  as described per history of present illness  EKGs/Labs/Other Studies Reviewed:    EKG Interpretation Date/Time:  Tuesday April 13 2024 13:27:20 EDT Ventricular Rate:  74 PR Interval:  158 QRS Duration:  68 QT Interval:  378 QTC Calculation: 419 R Axis:   8  Text Interpretation: Normal sinus rhythm Cannot rule out Inferior infarct , age undetermined Cannot rule out Anterior infarct , age undetermined Abnormal ECG No previous ECGs available Confirmed by Ralene Burger [phone]) on 04/13/2024 1:39:32 PM    Recent Labs: No results found for requested labs within last 365 days.  Recent Lipid Panel No results found for: "CHOL", "TRIG", "HDL", "CHOLHDL", "VLDL", "LDLCALC", "LDLDIRECT"  Physical Exam:    VS:  BP 136/78 (BP Location: Right Arm, Patient Position: Sitting)   Pulse 72   Ht 5\' 2"  (1.575 m)   Wt 195 lb (88.5 kg)   SpO2 97%   BMI 35.67 kg/m     Wt Readings from Last 3 Encounters:  04/13/24 195 lb (88.5 kg)  04/01/23 191 lb (86.6 kg)  12/25/22 180 lb (81.6 kg)     GEN:  Well nourished, well developed in no acute distress HEENT: Normal NECK: No JVD; No carotid bruits LYMPHATICS: No lymphadenopathy CARDIAC: RRR, no murmurs, no rubs, no gallops RESPIRATORY:  Clear to auscultation without rales, wheezing or rhonchi  ABDOMEN: Soft, non-tender, non-distended MUSCULOSKELETAL:  No edema; No deformity  SKIN: Warm and dry LOWER EXTREMITIES: no swelling NEUROLOGIC:  Alert and oriented x 3 PSYCHIATRIC:  Normal affect   ASSESSMENT:    1. Essential hypertension   2. Obstructive sleep apnea syndrome   3. Dyslipidemia   4. Atypical chest pain   5. Elevated coronary artery calcium score 95    PLAN:    In order of problems listed above:  Essential hypertension blood pressure well-controlled continue present management. Dyslipidemia I did review K PN which show me total cholesterol 230 HDL 39 we had a long discussion about the situation.  I did calculate her 10 years  predicted risk which is 9.9% which is considered high intermediate, coronary age 52.  I talked to her about potential taking statin she is afraid to take this medication because of potential side effect.  I talked to her about Zetia she said she will think it over it, I will send prescription and if she decide to start taking this medication will check fasting lipid off in 6 weeks. Atypical chest pain denies having any.  Elevated calcium score discussion as above   Medication Adjustments/Labs and Tests Ordered: Current medicines are reviewed at length with the patient today.  Concerns regarding medicines are outlined above.  Orders Placed This Encounter  Procedures   EKG 12-Lead   Medication changes: No orders of the defined types were placed in this encounter.   Signed,  Manfred Seed, MD, [facility] 04/13/2024 2:00 PM    [facility]

## 2024-04-13 NOTE — Patient Instructions (Addendum)
 Medication Instructions:   START: Zetia 10mg  1 tablet daily   Lab Work: Your physician recommends that you return for lab work in: 6 weeks You need to have labs done when you are fasting.  You can come Monday through Friday 8:30 am to 12:00 pm and 1:15 to 4:30. You do not need to make an appointment as the order has already been placed. The labs you are going to have done are AST, ALT Lipids.    Testing/Procedures: None Ordered   Follow-Up: At [facility], you and your health needs are our priority.  As part of our continuing mission to provide you with exceptional heart care, we have created designated Provider Care Teams.  These Care Teams include your primary Cardiologist (physician) and Advanced Practice Providers (APPs -  Physician Assistants and Nurse Practitioners) who all work together to provide you with the care you need, when you need it.  We recommend signing up for the patient portal called "MyChart".  Sign up information is provided on this After Visit Summary.  MyChart is used to connect with patients for Virtual Visits (Telemedicine).  Patients are able to view lab/test results, encounter notes, upcoming appointments, etc.  Non-urgent messages can be sent to your provider as well.   To learn more about what you can do with MyChart, go to [email or web address].    Your next appointment:   12 month(s)  The format for your next appointment:   In Person  Provider:   Ralene Burger, MD    Other Instructions NA

## 2024-04-26 DIAGNOSIS — Z01818 Encounter for other preprocedural examination: Secondary | ICD-10-CM | POA: Diagnosis not present

## 2024-06-04 DIAGNOSIS — B372 Candidiasis of skin and nail: Secondary | ICD-10-CM | POA: Diagnosis not present

## 2024-06-10 DIAGNOSIS — K5730 Diverticulosis of large intestine without perforation or abscess without bleeding: Secondary | ICD-10-CM | POA: Diagnosis not present

## 2024-06-10 DIAGNOSIS — K635 Polyp of colon: Secondary | ICD-10-CM | POA: Diagnosis not present

## 2024-06-10 DIAGNOSIS — E785 Hyperlipidemia, unspecified: Secondary | ICD-10-CM | POA: Diagnosis not present

## 2024-06-10 DIAGNOSIS — D126 Benign neoplasm of colon, unspecified: Secondary | ICD-10-CM | POA: Diagnosis not present

## 2024-06-10 DIAGNOSIS — D122 Benign neoplasm of ascending colon: Secondary | ICD-10-CM | POA: Diagnosis not present

## 2024-06-10 DIAGNOSIS — Z1211 Encounter for screening for malignant neoplasm of colon: Secondary | ICD-10-CM | POA: Diagnosis not present

## 2024-06-10 DIAGNOSIS — K644 Residual hemorrhoidal skin tags: Secondary | ICD-10-CM | POA: Diagnosis not present

## 2024-06-17 DIAGNOSIS — L089 Local infection of the skin and subcutaneous tissue, unspecified: Secondary | ICD-10-CM | POA: Diagnosis not present

## 2024-06-17 DIAGNOSIS — Z6835 Body mass index (BMI) 35.0-35.9, adult: Secondary | ICD-10-CM | POA: Diagnosis not present

## 2024-06-17 DIAGNOSIS — E663 Overweight: Secondary | ICD-10-CM | POA: Diagnosis not present

## 2024-06-22 DIAGNOSIS — L821 Other seborrheic keratosis: Secondary | ICD-10-CM | POA: Diagnosis not present

## 2024-06-22 DIAGNOSIS — L578 Other skin changes due to chronic exposure to nonionizing radiation: Secondary | ICD-10-CM | POA: Diagnosis not present

## 2024-06-22 DIAGNOSIS — L820 Inflamed seborrheic keratosis: Secondary | ICD-10-CM | POA: Diagnosis not present

## 2024-08-24 DIAGNOSIS — E785 Hyperlipidemia, unspecified: Secondary | ICD-10-CM | POA: Diagnosis not present

## 2024-08-24 DIAGNOSIS — Z6835 Body mass index (BMI) 35.0-35.9, adult: Secondary | ICD-10-CM | POA: Diagnosis not present

## 2024-08-24 DIAGNOSIS — Z1331 Encounter for screening for depression: Secondary | ICD-10-CM | POA: Diagnosis not present

## 2024-08-24 DIAGNOSIS — Z131 Encounter for screening for diabetes mellitus: Secondary | ICD-10-CM | POA: Diagnosis not present

## 2024-08-24 DIAGNOSIS — Z1339 Encounter for screening examination for other mental health and behavioral disorders: Secondary | ICD-10-CM | POA: Diagnosis not present

## 2024-08-24 DIAGNOSIS — I10 Essential (primary) hypertension: Secondary | ICD-10-CM | POA: Diagnosis not present

## 2024-08-24 DIAGNOSIS — Z79899 Other long term (current) drug therapy: Secondary | ICD-10-CM | POA: Diagnosis not present

## 2024-09-08 DIAGNOSIS — L089 Local infection of the skin and subcutaneous tissue, unspecified: Secondary | ICD-10-CM | POA: Diagnosis not present

## 2024-09-08 DIAGNOSIS — Z6835 Body mass index (BMI) 35.0-35.9, adult: Secondary | ICD-10-CM | POA: Diagnosis not present

## 2024-10-11 DIAGNOSIS — K080 Exfoliation of teeth due to systemic causes: Secondary | ICD-10-CM | POA: Diagnosis not present

## 2024-12-02 DIAGNOSIS — J40 Bronchitis, not specified as acute or chronic: Secondary | ICD-10-CM | POA: Diagnosis not present

## 2024-12-30 NOTE — Progress Notes (Signed)
 Melissa Mcguire                                          MRN: [id number]   12/30/2024   The [facility] Quality Team Specialist reviewed this patient medical record for the purposes of chart review for care gap closure. The following were reviewed: chart review for care gap closure-controlling blood pressure.    [facility]BCI Quality Team

## 2024-12-30 NOTE — Progress Notes (Signed)
 Melissa Mcguire                                          MRN: [id number]   12/30/2024   The [facility]I Quality Team Specialist reviewed this patient medical record for the purposes of chart review for care gap closure. The following were reviewed: abstraction for care gap closure-controlling blood pressure.    [facility]BCI Quality Team
